# Patient Record
Sex: Female | Born: 1955
Health system: Southern US, Community
[De-identification: ages and names within clinical notes are randomized; demographics above are authoritative.]

## PROBLEM LIST (undated history)

## (undated) DIAGNOSIS — Z9109 Other allergy status, other than to drugs and biological substances: Secondary | ICD-10-CM

## (undated) DIAGNOSIS — M810 Age-related osteoporosis without current pathological fracture: Secondary | ICD-10-CM

## (undated) DIAGNOSIS — Z22322 Carrier or suspected carrier of Methicillin resistant Staphylococcus aureus: Secondary | ICD-10-CM

## (undated) DIAGNOSIS — J189 Pneumonia, unspecified organism: Secondary | ICD-10-CM

## (undated) DIAGNOSIS — F419 Anxiety disorder, unspecified: Secondary | ICD-10-CM

## (undated) DIAGNOSIS — K219 Gastro-esophageal reflux disease without esophagitis: Secondary | ICD-10-CM

## (undated) DIAGNOSIS — R51 Headache: Secondary | ICD-10-CM

## (undated) DIAGNOSIS — M8580 Other specified disorders of bone density and structure, unspecified site: Secondary | ICD-10-CM

## (undated) DIAGNOSIS — I10 Essential (primary) hypertension: Secondary | ICD-10-CM

## (undated) DIAGNOSIS — L111 Transient acantholytic dermatosis [Grover]: Secondary | ICD-10-CM

## (undated) DIAGNOSIS — L719 Rosacea, unspecified: Secondary | ICD-10-CM

## (undated) DIAGNOSIS — F1011 Alcohol abuse, in remission: Secondary | ICD-10-CM

## (undated) DIAGNOSIS — R519 Headache, unspecified: Secondary | ICD-10-CM

## (undated) HISTORY — PX: BUNIONECTOMY: SHX129

## (undated) HISTORY — DX: Essential (primary) hypertension: I10

## (undated) HISTORY — DX: Rosacea, unspecified: L71.9

## (undated) HISTORY — PX: ARTHRODESIS FOOT WITH WEIL OSTEOTOMY AND CHILECTOMY: SHX5590

## (undated) HISTORY — DX: Carrier or suspected carrier of methicillin resistant Staphylococcus aureus: Z22.322

## (undated) HISTORY — DX: Alcohol abuse, in remission: F10.11

## (undated) HISTORY — PX: THERAPEUTIC ABORTION: SHX798

## (undated) HISTORY — DX: Anxiety disorder, unspecified: F41.9

## (undated) HISTORY — DX: Other allergy status, other than to drugs and biological substances: Z91.09

## (undated) HISTORY — DX: Other specified disorders of bone density and structure, unspecified site: M85.80

## (undated) HISTORY — PX: TONSILLECTOMY: SUR1361

---
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
## 1973-11-23 HISTORY — PX: CYSTOSCOPY: SUR368

## 1985-11-23 HISTORY — PX: LIPOMA EXCISION: SHX5283

## 1998-03-13 ENCOUNTER — Other Ambulatory Visit: Admission: RE | Admit: 1998-03-13 | Discharge: 1998-03-13 | Payer: Self-pay | Admitting: Obstetrics and Gynecology

## 1999-03-18 ENCOUNTER — Other Ambulatory Visit: Admission: RE | Admit: 1999-03-18 | Discharge: 1999-03-18 | Payer: Self-pay | Admitting: Gynecology

## 2000-02-27 ENCOUNTER — Encounter: Admission: RE | Admit: 2000-02-27 | Discharge: 2000-02-27 | Payer: Self-pay | Admitting: Family Medicine

## 2000-02-27 ENCOUNTER — Encounter: Payer: Self-pay | Admitting: Family Medicine

## 2000-03-10 ENCOUNTER — Other Ambulatory Visit: Admission: RE | Admit: 2000-03-10 | Discharge: 2000-03-10 | Payer: Self-pay | Admitting: Gynecology

## 2001-03-18 ENCOUNTER — Other Ambulatory Visit: Admission: RE | Admit: 2001-03-18 | Discharge: 2001-03-18 | Payer: Self-pay | Admitting: Gynecology

## 2002-06-09 ENCOUNTER — Other Ambulatory Visit: Admission: RE | Admit: 2002-06-09 | Discharge: 2002-06-09 | Payer: Self-pay | Admitting: Gynecology

## 2003-06-29 ENCOUNTER — Other Ambulatory Visit: Admission: RE | Admit: 2003-06-29 | Discharge: 2003-06-29 | Payer: Self-pay | Admitting: Gynecology

## 2004-07-03 ENCOUNTER — Other Ambulatory Visit: Admission: RE | Admit: 2004-07-03 | Discharge: 2004-07-03 | Payer: Self-pay | Admitting: Gynecology

## 2004-11-19 ENCOUNTER — Encounter: Admission: RE | Admit: 2004-11-19 | Discharge: 2004-11-19 | Payer: Self-pay | Admitting: Family Medicine

## 2005-07-07 ENCOUNTER — Other Ambulatory Visit: Admission: RE | Admit: 2005-07-07 | Discharge: 2005-07-07 | Payer: Self-pay | Admitting: Gynecology

## 2006-07-08 ENCOUNTER — Other Ambulatory Visit: Admission: RE | Admit: 2006-07-08 | Discharge: 2006-07-08 | Payer: Self-pay | Admitting: Gynecology

## 2007-07-11 ENCOUNTER — Other Ambulatory Visit: Admission: RE | Admit: 2007-07-11 | Discharge: 2007-07-11 | Payer: Self-pay | Admitting: Gynecology

## 2008-07-11 ENCOUNTER — Other Ambulatory Visit: Admission: RE | Admit: 2008-07-11 | Discharge: 2008-07-11 | Payer: Self-pay | Admitting: Gynecology

## 2008-08-20 ENCOUNTER — Ambulatory Visit: Payer: Self-pay | Admitting: Women's Health

## 2008-08-24 ENCOUNTER — Ambulatory Visit: Payer: Self-pay | Admitting: Women's Health

## 2009-01-03 ENCOUNTER — Encounter: Payer: Self-pay | Admitting: Women's Health

## 2009-01-03 ENCOUNTER — Ambulatory Visit: Payer: Self-pay | Admitting: Women's Health

## 2009-01-03 ENCOUNTER — Other Ambulatory Visit: Admission: RE | Admit: 2009-01-03 | Discharge: 2009-01-03 | Payer: Self-pay | Admitting: Gynecology

## 2009-07-12 ENCOUNTER — Other Ambulatory Visit: Admission: RE | Admit: 2009-07-12 | Discharge: 2009-07-12 | Payer: Self-pay | Admitting: Gynecology

## 2009-07-12 ENCOUNTER — Ambulatory Visit: Payer: Self-pay | Admitting: Women's Health

## 2009-07-12 ENCOUNTER — Encounter: Payer: Self-pay | Admitting: Women's Health

## 2010-08-29 ENCOUNTER — Other Ambulatory Visit: Admission: RE | Admit: 2010-08-29 | Discharge: 2010-08-29 | Payer: Self-pay | Admitting: Obstetrics and Gynecology

## 2010-08-29 ENCOUNTER — Ambulatory Visit: Payer: Self-pay | Admitting: Women's Health

## 2010-10-15 ENCOUNTER — Ambulatory Visit: Payer: Self-pay | Admitting: Gynecology

## 2011-08-28 ENCOUNTER — Encounter: Payer: Self-pay | Admitting: Anesthesiology

## 2011-09-07 ENCOUNTER — Ambulatory Visit (INDEPENDENT_AMBULATORY_CARE_PROVIDER_SITE_OTHER): Payer: 59 | Admitting: Women's Health

## 2011-09-07 ENCOUNTER — Encounter: Payer: Self-pay | Admitting: Women's Health

## 2011-09-07 ENCOUNTER — Other Ambulatory Visit (HOSPITAL_COMMUNITY)
Admission: RE | Admit: 2011-09-07 | Discharge: 2011-09-07 | Disposition: A | Discharge status: Home / Self Care | Payer: 59 | Source: Ambulatory Visit | Attending: Gynecology | Admitting: Gynecology

## 2011-09-07 VITALS — BP 130/72 | Ht 64.5 in | Wt 150.0 lb

## 2011-09-07 DIAGNOSIS — F411 Generalized anxiety disorder: Secondary | ICD-10-CM

## 2011-09-07 DIAGNOSIS — B009 Herpesviral infection, unspecified: Secondary | ICD-10-CM

## 2011-09-07 DIAGNOSIS — R102 Pelvic and perineal pain: Secondary | ICD-10-CM

## 2011-09-07 DIAGNOSIS — N949 Unspecified condition associated with female genital organs and menstrual cycle: Secondary | ICD-10-CM

## 2011-09-07 DIAGNOSIS — F419 Anxiety disorder, unspecified: Secondary | ICD-10-CM

## 2011-09-07 DIAGNOSIS — A609 Anogenital herpesviral infection, unspecified: Secondary | ICD-10-CM

## 2011-09-07 DIAGNOSIS — Z01419 Encounter for gynecological examination (general) (routine) without abnormal findings: Secondary | ICD-10-CM

## 2011-09-07 DIAGNOSIS — Z833 Family history of diabetes mellitus: Secondary | ICD-10-CM

## 2011-09-07 MED ORDER — ALPRAZOLAM 0.25 MG PO TABS: 0.2500 mg | ORAL_TABLET | Freq: Every evening | ORAL | Status: AC | PRN

## 2011-09-07 MED ORDER — VALACYCLOVIR HCL 500 MG PO TABS: 500.0000 mg | ORAL_TABLET | Freq: Every day | ORAL | Status: AC

## 2011-09-07 NOTE — Progress Notes (Signed)
Kimberly Hale 1956/08/23 [id number]    History:    The patient presents for annual exam.  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  Past medical history, past surgical history, family history and social history were all reviewed and documented in the EPIC chart.   ROS:  A  ROS was performed and pertinent positives and negatives are included in the history.  Exam:  Filed Vitals:   09/07/11 0832  BP: 130/72    General appearance:  Normal Head/Neck:  Normal, without cervical or supraclavicular adenopathy. Thyroid:  Symmetrical, normal in size, without palpable masses or nodularity. Respiratory  Effort:  Normal  Auscultation:  Clear without wheezing or rhonchi Cardiovascular  Auscultation:  Regular rate, without rubs, murmurs or gallops  Edema/varicosities:  Not grossly evident Abdominal  Soft,nontender, without masses, guarding or rebound.  Liver/spleen:  No organomegaly noted  Hernia:  None appreciated  Skin  Inspection:  Grossly normal  Palpation:  Grossly normal Neurologic/psychiatric  Orientation:  Normal with appropriate conversation.  Mood/affect:  Normal  Genitourinary    Breasts: Examined lying and sitting.     Right: Without masses, retractions, discharge or axillary adenopathy.     Left: Without masses, retractions, discharge or axillary adenopathy.   Inguinal/mons:  Normal without inguinal adenopathy  External genitalia:  Normal  BUS/Urethra/Skene's glands:  Normal  Bladder:  Normal  Vagina:  Normal  Cervix:  Normal,stenotic  Uterus:   normal in size, shape and contour.  Midline and mobile  Adnexa/parametria:     Rt: Without masses or tenderness.   Lt: Without masses or tenderness.  Anus and perineum: Normal  Digital rectal exam: Normal sphincter tone without palpated masses or tenderness  Assessment/Plan:  55 y.o. MWF G3P1 for annual exam. Postmenopausal on no HRT with no bleeding. States has had some intermittent low pelvic cramping.  Denies any GI problems. Negative colonoscopy in 07. DEXA in 2011 osteopenia with a T score -1.8. Fall prevention, home safety reviewed, calcium rich diet encouraged, continue vitamin D 1000 daily.    Postmenopausal with lower abdominal cramping   Plan: Ultrasound to rule out GYN problem, will schedule. SBEs, annual mammogram which have been normal. Calcium rich diet, vitamin D 1000 daily encouraged. Encouraged exercise. Weight has been stable, has lost several pounds from last year. CBC, glucose UA and Pap had a normal lipid profile last year will repeat next. Has rare HSV outbreaks, Valtrex 500 twice a day for several days as needed prescription was given. Uses Xanax 0.25 rarely, prescription was given is aware of addictive properties.   Harrington Challenger WHNP, 9:20 AM 09/07/2011

## 2011-09-17 ENCOUNTER — Ambulatory Visit (INDEPENDENT_AMBULATORY_CARE_PROVIDER_SITE_OTHER): Payer: 59 | Admitting: Women's Health

## 2011-09-17 ENCOUNTER — Other Ambulatory Visit: Payer: Self-pay | Admitting: Women's Health

## 2011-09-17 ENCOUNTER — Ambulatory Visit (INDEPENDENT_AMBULATORY_CARE_PROVIDER_SITE_OTHER): Payer: 59

## 2011-09-17 DIAGNOSIS — N949 Unspecified condition associated with female genital organs and menstrual cycle: Secondary | ICD-10-CM

## 2011-09-17 DIAGNOSIS — R102 Pelvic and perineal pain: Secondary | ICD-10-CM

## 2011-09-17 DIAGNOSIS — N83209 Unspecified ovarian cyst, unspecified side: Secondary | ICD-10-CM

## 2011-09-17 NOTE — Progress Notes (Signed)
  Presents for an ultrasound related to pelvic discomfort/pressure discussed at annual exam last week. Postmenopausal with no bleeding on no HRT. Ultrasound today does show an endometrium of 3.6 mm, retroverted uterus, right ovary normal, left ovary shows a solid mass on the border of the ovary 20 x 15 mm with positive PFD, difficult to image. No previous ultrasounds to compare.  Plan: CA 125, if normal, ultrasound in 2 months to check for resolution or stability.

## 2011-11-15 ENCOUNTER — Emergency Department (HOSPITAL_COMMUNITY)
Admission: EM | Admit: 2011-11-15 | Discharge: 2011-11-16 | Disposition: A | Discharge status: Home / Self Care | Payer: 59 | Attending: Emergency Medicine | Admitting: Emergency Medicine

## 2011-11-15 ENCOUNTER — Encounter (HOSPITAL_COMMUNITY): Payer: Self-pay | Admitting: *Deleted

## 2011-11-15 DIAGNOSIS — S61011A Laceration without foreign body of right thumb without damage to nail, initial encounter: Secondary | ICD-10-CM

## 2011-11-15 DIAGNOSIS — W268XXA Contact with other sharp object(s), not elsewhere classified, initial encounter: Secondary | ICD-10-CM | POA: Insufficient documentation

## 2011-11-15 DIAGNOSIS — S61209A Unspecified open wound of unspecified finger without damage to nail, initial encounter: Secondary | ICD-10-CM | POA: Insufficient documentation

## 2011-11-15 NOTE — ED Notes (Signed)
C/o L thumb lac, cut with razor blade while trying to get a label of a jar, Td UTD , (unsure sometime in last 10 yrs), describes pain as mild.

## 2011-11-16 NOTE — ED Provider Notes (Signed)
History     CSN: [id number]  Arrival date & time 11/15/11  2156   First MD Initiated Contact with Patient 11/15/11 2323      Chief Complaint  Patient presents with  . Laceration    (Consider location/radiation/quality/duration/timing/severity/associated sxs/prior treatment) HPI Comments: Patient was cutting a label of the jar with a razor blade slipped and she has a small superficial laceration to the very distal tip of her left thumb.  That was bleeding at this time.  The bleeding has subsided.  Last tetanus shot within the last 10 years, but in the latter part of 10, years.  We'll update today  Patient is a 55 y.o. female presenting with skin laceration. The history is provided by the patient.  Laceration  The incident occurred 1 to 2 hours ago. The laceration is located on the left hand. The laceration is 1 cm in size. The laceration mechanism was a a metal edge. The pain is at a severity of 1/10. The pain is mild. Her tetanus status is out of date.    Past Medical History  Diagnosis Date  . Rosacea   . Anxiety   . Environmental allergies   . Osteopenia     07/2008 DEXA  . History of ETOH abuse     NONE IN 10 YEARS    Past Surgical History  Procedure Date  . Bunionectomy   . Cesarean section 1988  . Lipoma excision 1987  . Therapeutic abortion     X2  . Cystoscopy 1975    Family History  Problem Relation Age of Onset  . Alcohol abuse Mother   . Heart disease Father   . Alcohol abuse Father   . Diabetes Maternal Grandmother   . Hypertension Maternal Grandmother     History  Substance Use Topics  . Smoking status: Never Smoker   . Smokeless tobacco: Never Used  . Alcohol Use: No    OB History    Grav Para Term Preterm Abortions TAB SAB Ect Mult Living   3 1   2 2    1       Review of Systems  Constitutional: Negative.   HENT: Negative.   Eyes: Negative.   Cardiovascular: Negative.   Genitourinary: Negative.   Musculoskeletal: Negative.     Neurological: Negative.   Hematological: Negative.   Psychiatric/Behavioral: Negative.     Allergies  Sulfa antibiotics  Home Medications   Current Outpatient Rx  Name Route Sig Dispense Refill  . BUSPIRONE HCL 10 MG PO TABS Oral Take 10 mg by mouth. 1/2 tab bid    . CALCIUM CARBONATE 600 MG PO TABS Oral Take 600 mg by mouth daily.     Marland Kitchen VITAMIN D 1000 UNITS PO TABS Oral Take 1,000 Units by mouth daily.      Marland Kitchen FLUTICASONE PROPIONATE 50 MCG/ACT NA SUSP Nasal Place 2 sprays into the nose daily.      . IBUPROFEN 200 MG PO TABS Oral Take 400 mg by mouth every 6 (six) hours as needed. For pain.    Marland Kitchen METRONIDAZOLE 0.75 % EX GEL Topical Apply topically 2 (two) times daily.      Marland Kitchen ONE-DAILY MULTI VITAMINS PO TABS Oral Take 1 tablet by mouth daily.        BP 146/80  Pulse 88  Temp 98.1 F (36.7 C)  Resp 18  SpO2 98%  LMP 10/08/2007  Physical Exam  Constitutional: She is oriented to person, place, and time. She appears well-developed  and well-nourished.  HENT:  Head: Normocephalic.  Eyes: Pupils are equal, round, and reactive to light.  Neck: Normal range of motion.  Pulmonary/Chest: Effort normal.  Musculoskeletal: She exhibits no edema and no tenderness.  Neurological: She is oriented to person, place, and time.  Skin:       Superficial laceration, less than 1/2 cm long just at the distal tip of the left thumb lateral to nail    ED Course  LACERATION REPAIR Performed by: Arman Filter Authorized by: Arman Filter Consent: Verbal consent obtained. Consent given by: patient Patient identity confirmed: verbally with patient Body area: upper extremity Tendon involvement: none Nerve involvement: none Vascular damage: no Patient sedated: no Irrigation solution: saline Debridement: none Degree of undermining: none Skin closure: glue Patient tolerance: Patient tolerated the procedure well with no immediate complications.   (including critical care time)  Labs  Reviewed - No data to display No results found.   1. Laceration of right thumb       MDM  Superficial laceration distal left        Arman Filter, NP 11/16/11 0035  Arman Filter, NP 11/16/11 [phone]

## 2011-11-16 NOTE — ED Provider Notes (Signed)
Medical screening examination/treatment/procedure(s) were performed by non-physician practitioner and as supervising physician I was immediately available for consultation/collaboration.   Beauty Pless M Xylah Early, DO 11/16/11 0925 

## 2011-11-16 NOTE — ED Notes (Signed)
Pt states she understands discharge instructions.

## 2011-11-20 ENCOUNTER — Other Ambulatory Visit: Payer: Self-pay | Admitting: Women's Health

## 2011-11-20 ENCOUNTER — Ambulatory Visit (INDEPENDENT_AMBULATORY_CARE_PROVIDER_SITE_OTHER): Payer: 59 | Admitting: Women's Health

## 2011-11-20 ENCOUNTER — Ambulatory Visit: Payer: 59

## 2011-11-20 DIAGNOSIS — N83209 Unspecified ovarian cyst, unspecified side: Secondary | ICD-10-CM

## 2011-11-20 DIAGNOSIS — R1904 Left lower quadrant abdominal swelling, mass and lump: Secondary | ICD-10-CM

## 2011-11-20 DIAGNOSIS — R1032 Left lower quadrant pain: Secondary | ICD-10-CM

## 2011-11-20 NOTE — Progress Notes (Signed)
Patient ID: Kimberly Hale, female   DOB: 27-Jun-1956, 55 y.o.   MRN: [id number] Presents for [place] followup.  [place] 09/17/11 showed solid mass inferior to left ovary 20x45mm, Ca 125 was 6.1.  [place] today: lt ovarian solid avascular 10x13x7mm, pain free now. No uterine abnormalities seen. Rt ovary normal with small echofree, thin walled avascular cyst 5x74mm.    Plan repeat US in 6 months to check for stability/resolution.  Instructed to call or return to office if changes, pain or bloating.

## 2011-12-13 ENCOUNTER — Other Ambulatory Visit: Payer: Self-pay | Admitting: Women's Health

## 2012-01-19 ENCOUNTER — Ambulatory Visit
Admission: RE | Admit: 2012-01-19 | Discharge: 2012-01-19 | Disposition: A | Discharge status: Home / Self Care | Payer: 59 | Source: Ambulatory Visit | Attending: Family Medicine | Admitting: Family Medicine

## 2012-01-19 ENCOUNTER — Other Ambulatory Visit: Payer: Self-pay | Admitting: Family Medicine

## 2012-01-19 DIAGNOSIS — W19XXXA Unspecified fall, initial encounter: Secondary | ICD-10-CM

## 2012-05-19 ENCOUNTER — Encounter: Payer: Self-pay | Admitting: Obstetrics and Gynecology

## 2012-09-09 ENCOUNTER — Ambulatory Visit (INDEPENDENT_AMBULATORY_CARE_PROVIDER_SITE_OTHER): Payer: 59 | Admitting: Women's Health

## 2012-09-09 ENCOUNTER — Encounter: Payer: Self-pay | Admitting: Women's Health

## 2012-09-09 VITALS — BP 142/86 | Ht 64.5 in | Wt 154.0 lb

## 2012-09-09 DIAGNOSIS — L719 Rosacea, unspecified: Secondary | ICD-10-CM

## 2012-09-09 DIAGNOSIS — M899 Disorder of bone, unspecified: Secondary | ICD-10-CM

## 2012-09-09 DIAGNOSIS — F419 Anxiety disorder, unspecified: Secondary | ICD-10-CM

## 2012-09-09 DIAGNOSIS — F411 Generalized anxiety disorder: Secondary | ICD-10-CM

## 2012-09-09 DIAGNOSIS — M8580 Other specified disorders of bone density and structure, unspecified site: Secondary | ICD-10-CM

## 2012-09-09 DIAGNOSIS — Z13220 Encounter for screening for lipoid disorders: Secondary | ICD-10-CM

## 2012-09-09 DIAGNOSIS — B009 Herpesviral infection, unspecified: Secondary | ICD-10-CM

## 2012-09-09 DIAGNOSIS — Z01419 Encounter for gynecological examination (general) (routine) without abnormal findings: Secondary | ICD-10-CM

## 2012-09-09 DIAGNOSIS — A609 Anogenital herpesviral infection, unspecified: Secondary | ICD-10-CM

## 2012-09-09 DIAGNOSIS — Z833 Family history of diabetes mellitus: Secondary | ICD-10-CM

## 2012-09-09 LAB — CBC WITH DIFFERENTIAL/PLATELET
Basophils Absolute: 0 10*3/uL (ref 0.0–0.1)
Basophils Relative: 1 % (ref 0–1)
Eosinophils Absolute: 0.1 10*3/uL (ref 0.0–0.7)
Eosinophils Relative: 2 % (ref 0–5)
MCH: 30.2 pg (ref 26.0–34.0)
MCHC: 35 g/dL (ref 30.0–36.0)
MCV: 86.1 fL (ref 78.0–100.0)
Neutrophils Relative %: 57 % (ref 43–77)
Platelets: 284 10*3/uL (ref 150–400)
RBC: 4.61 MIL/uL (ref 3.87–5.11)
RDW: 13.4 % (ref 11.5–15.5)

## 2012-09-09 LAB — GLUCOSE, RANDOM: Glucose, Bld: 95 mg/dL (ref 70–99)

## 2012-09-09 LAB — LIPID PANEL: LDL Cholesterol: 134 mg/dL — ABNORMAL HIGH (ref 0–99)

## 2012-09-09 MED ORDER — VALACYCLOVIR HCL 500 MG PO TABS: 500.0000 mg | ORAL_TABLET | Freq: Every morning | ORAL | Status: DC

## 2012-09-09 MED ORDER — ALPRAZOLAM 0.25 MG PO TABS: 0.2500 mg | ORAL_TABLET | Freq: Every evening | ORAL | Status: DC | PRN

## 2012-09-09 NOTE — Progress Notes (Signed)
Kimberly Hale August 14, 1956 [id number]    History:    The patient presents for annual exam. Postmenopausal with no bleeding on no HRT. Rosacea managed by dermatologist with [facility] twice daily. Osteopenia T score of -1.8 right femoral neck 11/11. Has left hip pain, T score 0.2. History of normal Paps and mammograms. Normal colonoscopy 2007.  Past medical history, past surgical history, family history and social history were all reviewed and documented in the EPIC chart. 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 History of alcohol abuse many years ago, both parents history of alcohol abuse.   ROS:  A  ROS was performed and pertinent positives and negatives are included in the history.   Exam:  Filed Vitals:   09/09/12 0907  BP: 142/86    General appearance:  Normal Head/Neck:  Normal, without cervical or supraclavicular adenopathy. Thyroid:  Symmetrical, normal in size, without palpable masses or nodularity. Respiratory  Effort:  Normal  Auscultation:  Clear without wheezing or rhonchi Cardiovascular  Auscultation:  Regular rate, without rubs, murmurs or gallops  Edema/varicosities:  Not grossly evident Abdominal  Soft,nontender, without masses, guarding or rebound.  Liver/spleen:  No organomegaly noted  Hernia:  None appreciated  Skin  Inspection:  Grossly normal  Palpation:  Grossly normal Neurologic/psychiatric  Orientation:  Normal with appropriate conversation.  Mood/affect:  Normal  Genitourinary    Breasts: Examined lying and sitting.     Right: Without masses, retractions, discharge or axillary adenopathy.     Left: Without masses, retractions, discharge or axillary adenopathy.   Inguinal/mons:  Normal without inguinal adenopathy  External genitalia:  Normal  BUS/Urethra/Skene's glands:  Normal  Bladder:  Normal  Vagina:  Normal  Cervix:  Normal  Uterus:  normal in size, shape and contour.  Midline and mobile  Adnexa/parametria:     Rt: Without masses or  tenderness.   Lt: Without masses or tenderness.  Anus and perineum: Normal  Digital rectal exam: Normal sphincter tone without palpated masses or tenderness  Assessment/Plan:  56 y.o. MWF G3 P1 for annual exam with no complaints.  Blood pressure  144/86 today Postmenopausal no HRT/ no bleeding Osteopenia T score of -1.8 right femoral neck - 2011 Rosacea managed dermatologist  Plan: Reports normal blood pressure at [employer] when donating blood, will monitor blood pressure if greater than 130/80 will followup with primary care. SBE's, continue annual mammogram, calcium rich diet, vitamin D 2000 daily encouraged. Repeat DEXA this year, history of osteopenia will schedule. CBC, glucose, lipid panel, UA. No Pap normal Pap 2012 new screening guidelines reviewed. Has occasional anxiety, Xanax 0.25 prescription given aware of addictive properties and uses sparingly.            Harrington Challenger WHNP, 10:15 AM 09/09/2012

## 2012-09-09 NOTE — Patient Instructions (Addendum)

## 2012-09-10 LAB — URINALYSIS W MICROSCOPIC + REFLEX CULTURE
Casts: NONE SEEN
Glucose, UA: NEGATIVE mg/dL
Hgb urine dipstick: NEGATIVE
Leukocytes, UA: NEGATIVE
Nitrite: NEGATIVE
Protein, ur: NEGATIVE mg/dL
Squamous Epithelial / LPF: NONE SEEN
pH: 7.5 (ref 5.0–8.0)

## 2012-09-16 ENCOUNTER — Encounter: Payer: Self-pay | Admitting: Gynecology

## 2012-09-19 ENCOUNTER — Other Ambulatory Visit: Payer: Self-pay | Admitting: Anesthesiology

## 2012-09-19 DIAGNOSIS — Z1211 Encounter for screening for malignant neoplasm of colon: Secondary | ICD-10-CM

## 2012-09-19 DIAGNOSIS — K921 Melena: Secondary | ICD-10-CM

## 2012-09-20 ENCOUNTER — Other Ambulatory Visit: Payer: Self-pay | Admitting: Women's Health

## 2012-09-20 DIAGNOSIS — Z1211 Encounter for screening for malignant neoplasm of colon: Secondary | ICD-10-CM

## 2012-11-22 ENCOUNTER — Other Ambulatory Visit: Payer: Self-pay | Admitting: Gynecology

## 2012-11-22 ENCOUNTER — Ambulatory Visit (INDEPENDENT_AMBULATORY_CARE_PROVIDER_SITE_OTHER): Payer: 59

## 2012-11-22 DIAGNOSIS — M8580 Other specified disorders of bone density and structure, unspecified site: Secondary | ICD-10-CM

## 2012-11-22 DIAGNOSIS — M899 Disorder of bone, unspecified: Secondary | ICD-10-CM

## 2012-11-24 ENCOUNTER — Telehealth: Payer: Self-pay | Admitting: Gynecology

## 2012-11-24 ENCOUNTER — Encounter: Payer: Self-pay | Admitting: Gynecology

## 2012-11-24 NOTE — Telephone Encounter (Signed)
Tell patient that her bone density does show osteopenia with some deterioration and I want to talk to her about treatment options.  Recommend office visit to discuss.

## 2012-11-24 NOTE — Telephone Encounter (Signed)
Left message for pt to call.

## 2012-11-25 NOTE — Telephone Encounter (Signed)
Patient informed.  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

## 2012-11-25 NOTE — Telephone Encounter (Signed)
Left message for patient to call me regarding Bone Density test results.

## 2013-01-07 ENCOUNTER — Other Ambulatory Visit: Payer: Self-pay

## 2013-01-24 ENCOUNTER — Institutional Professional Consult (permissible substitution): Payer: 59 | Admitting: Gynecology

## 2013-02-03 ENCOUNTER — Encounter: Payer: Self-pay | Admitting: Gynecology

## 2013-02-03 ENCOUNTER — Ambulatory Visit: Payer: Commercial Managed Care - PPO | Admitting: Gynecology

## 2013-02-03 DIAGNOSIS — M8580 Other specified disorders of bone density and structure, unspecified site: Secondary | ICD-10-CM

## 2013-02-03 DIAGNOSIS — M810 Age-related osteoporosis without current pathological fracture: Secondary | ICD-10-CM

## 2013-02-03 NOTE — Patient Instructions (Signed)
Followup for annual exam in October 2014. Repeat bone density in 2 years.

## 2013-02-03 NOTE — Progress Notes (Signed)
Patient presents to discuss her recent DEXA results showing T score -2.2 with FRAX 30%/2.8%. I reviewed the issues of osteopenia/osteoporosis and increased fracture risk. Comparing from prior study she did show a statistically significant decline in both hips and her spine was normal stable. On review of her FRAX questionnaire it was checked positive for previous fracture and positive for parenteral fracture. Her mother did fracture both hips 1 at 55 and 1 at 22 she is alive at 62. No other family history of osteoporosis/fracture. The patient did fracture her ankle last year while chasing her dog through the woods and she turned her ankle. I reviewed with her that this does not sound like a fragility fracture and we recalculated her FRAX answering no to previous fracture and her new 10 year probability is 19% major 1.6% hip fracture. She still recognizes that she is significantly osteopenic with decline from her prior study. Treatment options reviewed to include bisphosphates. Risks discussed include osteonecrosis of the jaw atypical fractures particularly with prolonged use GERD and esophageal cancer. After lengthy discussion the patient and I both agree to hold on treatment at this point check her vitamin D level today maximize calcium and vitamin D intake weightbearing exercise and recheck her DEXA in 2 years.

## 2013-03-13 ENCOUNTER — Other Ambulatory Visit: Payer: Self-pay | Admitting: Women's Health

## 2013-05-23 ENCOUNTER — Encounter: Payer: Self-pay | Admitting: Gynecology

## 2013-09-15 ENCOUNTER — Ambulatory Visit (INDEPENDENT_AMBULATORY_CARE_PROVIDER_SITE_OTHER): Payer: Commercial Managed Care - PPO | Admitting: Women's Health

## 2013-09-15 ENCOUNTER — Encounter: Payer: Self-pay | Admitting: Women's Health

## 2013-09-15 VITALS — BP 140/82 | Ht 64.35 in | Wt 155.0 lb

## 2013-09-15 DIAGNOSIS — M8580 Other specified disorders of bone density and structure, unspecified site: Secondary | ICD-10-CM

## 2013-09-15 DIAGNOSIS — B009 Herpesviral infection, unspecified: Secondary | ICD-10-CM

## 2013-09-15 DIAGNOSIS — E079 Disorder of thyroid, unspecified: Secondary | ICD-10-CM

## 2013-09-15 DIAGNOSIS — F419 Anxiety disorder, unspecified: Secondary | ICD-10-CM

## 2013-09-15 DIAGNOSIS — Z13220 Encounter for screening for lipoid disorders: Secondary | ICD-10-CM

## 2013-09-15 DIAGNOSIS — Z833 Family history of diabetes mellitus: Secondary | ICD-10-CM

## 2013-09-15 DIAGNOSIS — Z01419 Encounter for gynecological examination (general) (routine) without abnormal findings: Secondary | ICD-10-CM

## 2013-09-15 DIAGNOSIS — F411 Generalized anxiety disorder: Secondary | ICD-10-CM

## 2013-09-15 DIAGNOSIS — M899 Disorder of bone, unspecified: Secondary | ICD-10-CM

## 2013-09-15 LAB — CBC WITH DIFFERENTIAL/PLATELET
Basophils Absolute: 0 10*3/uL (ref 0.0–0.1)
Basophils Relative: 1 % (ref 0–1)
Eosinophils Absolute: 0.2 10*3/uL (ref 0.0–0.7)
Eosinophils Relative: 3 % (ref 0–5)
HCT: 40.3 % (ref 36.0–46.0)
MCH: 30.4 pg (ref 26.0–34.0)
MCHC: 34.7 g/dL (ref 30.0–36.0)
MCV: 87.4 fL (ref 78.0–100.0)
Monocytes Absolute: 0.5 10*3/uL (ref 0.1–1.0)
Platelets: 274 10*3/uL (ref 150–400)
RDW: 14 % (ref 11.5–15.5)
WBC: 5.8 10*3/uL (ref 4.0–10.5)

## 2013-09-15 MED ORDER — VALACYCLOVIR HCL 500 MG PO TABS: ORAL_TABLET | ORAL | Status: DC

## 2013-09-15 MED ORDER — ALPRAZOLAM 0.25 MG PO TABS: 0.2500 mg | ORAL_TABLET | Freq: Every evening | ORAL | Status: DC | PRN

## 2013-09-15 NOTE — Progress Notes (Signed)
Kimberly Hale 01-31-1956 [id number]    History:    The patient presents for annual exam.  Postmenopausal/no HRT/no bleeding. Normal Pap and mammogram history. Positive home Hemoccult  2013, negative colonoscopy 11/2012.  DEXA 2014 T score -2.2 right femoral neck FRAX 30%/2.8%, discussed with Dr. Audie Box, will continue exercise, vitamin D. Rare HSV outbreaks.    Past medical history, past surgical history, family history and social history were all reviewed and documented in the EPIC chart. Position a [employer] per dermatologist. Anxiety/depression managed primary care. Works at [employer], husband Radio producer, son graduated Building services engineer. History of alcohol abuse none in many years. Both parents alcohol abuse.   ROS:  A  ROS was performed and pertinent positives and negatives are included in the history.  Exam:  Filed Vitals:   09/15/13 1609  BP: 140/82    General appearance:  Normal Head/Neck:  Normal, without cervical or supraclavicular adenopathy. Thyroid:  Symmetrical, normal in size, without palpable masses or nodularity. Respiratory  Effort:  Normal  Auscultation:  Clear without wheezing or rhonchi Cardiovascular  Auscultation:  Regular rate, without rubs, murmurs or gallops  Edema/varicosities:  Not grossly evident Abdominal  Soft,nontender, without masses, guarding or rebound.  Liver/spleen:  No organomegaly noted  Hernia:  None appreciated  Skin  Inspection:  Grossly normal  Palpation:  Grossly normal Neurologic/psychiatric  Orientation:  Normal with appropriate conversation.  Mood/affect:  Normal  Genitourinary    Breasts: Examined lying and sitting.     Right: Without masses, retractions, discharge or axillary adenopathy.     Left: Without masses, retractions, discharge or axillary adenopathy.   Inguinal/mons:  Normal without inguinal adenopathy  External genitalia:  Normal  BUS/Urethra/Skene's glands:  Normal  Bladder:  Normal  Vagina:  Normal  Cervix:   Normal/stenotic  Uterus:  normal in size, shape and contour.  Midline and mobile  Adnexa/parametria:     Rt: Without masses or tenderness.   Lt: Without masses or tenderness.  Anus and perineum: Normal  Digital rectal exam: Normal sphincter tone without palpated masses or tenderness  Assessment/Plan:  57 y.o. M. WF G3 P1  for annual exam with no complaints.  Osteopenia Borderline blood pressure HSV-rare outbreaks Anxiety/depression-primary care manages meds  Plan: Continue to monitor blood pressure, reports less than 120/70 when donating blood every 2 months. Aware of hazards of blood pressure. Home safety, fall prevention, importance of regular exercise, calcium rich diet, vitamin D 2000 daily encouraged. Valtrex 500 twice daily for 3-5 days as needed, prescription proper use given and reviewed. CBC, comprehensive metabolic, lipid panel, UA, Pap. Pap normal 2012, new screening guidelines reviewed    Harrington Challenger [facility], 5:15 PM 09/15/2013

## 2013-09-15 NOTE — Patient Instructions (Signed)
Health Recommendations for Postmenopausal Women Respected and ongoing research has looked at the most common causes of death, disability, and poor quality of life in postmenopausal women. The causes include heart disease, diseases of blood vessels, diabetes, depression, cancer, and bone loss (osteoporosis). Many things can be done to help lower the chances of developing these and other common problems: CARDIOVASCULAR DISEASE Heart Disease: A heart attack is a medical emergency. Know the signs and symptoms of a heart attack. Below are things women can do to reduce their risk for heart disease.   Do not smoke. If you smoke, quit.  Aim for a healthy weight. Being overweight causes many preventable deaths. Eat a healthy and balanced diet and drink an adequate amount of liquids.  Get moving. Make a commitment to be more physically active. Aim for 30 minutes of activity on most, if not all days of the week.  Eat for heart health. Choose a diet that is low in saturated fat and cholesterol and eliminate trans fat. Include whole grains, vegetables, and fruits. Read and understand the labels on food containers before buying.  Know your numbers. Ask your caregiver to check your blood pressure, cholesterol (total, HDL, LDL, triglycerides) and blood glucose. Work with your caregiver on improving your entire clinical picture.  High blood pressure. Limit or stop your table salt intake (try salt substitute and food seasonings). Avoid salty foods and drinks. Read labels on food containers before buying. Eating well and exercising can help control high blood pressure. STROKE  Stroke is a medical emergency. Stroke may be the result of a blood clot in a blood vessel in the brain or by a brain hemorrhage (bleeding). Know the signs and symptoms of a stroke. To lower the risk of developing a stroke:  Avoid fatty foods.  Quit smoking.  Control your diabetes, blood pressure, and irregular heart rate. THROMBOPHLEBITIS  (BLOOD CLOT) OF THE LEG  Becoming overweight and leading a stationary lifestyle may also contribute to developing blood clots. Controlling your diet and exercising will help lower the risk of developing blood clots. CANCER SCREENING  Breast Cancer: Take steps to reduce your risk of breast cancer.  You should practice "breast self-awareness." This means understanding the normal appearance and feel of your breasts and should include breast self-examination. Any changes detected, no matter how small, should be reported to your caregiver.  After age 40, you should have a clinical breast exam (CBE) every year.  Starting at age 40, you should consider having a mammogram (breast X-ray) every year.  If you have a family history of breast cancer, talk to your caregiver about genetic screening.  If you are at high risk for breast cancer, talk to your caregiver about having an MRI and a mammogram every year.  Intestinal or Stomach Cancer: Tests to consider are a rectal exam, fecal occult blood, sigmoidoscopy, and colonoscopy. Women who are high risk may need to be screened at an earlier age and more often.  Cervical Cancer:  Beginning at age 30, you should have a Pap test every 3 years as long as the past 3 Pap tests have been normal.  If you have had past treatment for cervical cancer or a condition that could lead to cancer, you need Pap tests and screening for cancer for at least 20 years after your treatment.  If you had a hysterectomy for a problem that was not cancer or a condition that could lead to cancer, then you no longer need Pap tests.    If you are between ages 65 and 70, and you have had normal Pap tests going back 10 years, you no longer need Pap tests.  If Pap tests have been discontinued, risk factors (such as a new sexual partner) need to be reassessed to determine if screening should be resumed.  Some medical problems can increase the chance of getting cervical cancer. In these  cases, your caregiver may recommend more frequent screening and Pap tests.  Uterine Cancer: If you have vaginal bleeding after reaching menopause, you should notify your caregiver.  Ovarian cancer: Other than yearly pelvic exams, there are no reliable tests available to screen for ovarian cancer at this time except for yearly pelvic exams.  Lung Cancer: Yearly chest X-rays can detect lung cancer and should be done on high risk women, such as cigarette smokers and women with chronic lung disease (emphysema).  Skin Cancer: A complete body skin exam should be done at your yearly examination. Avoid overexposure to the sun and ultraviolet light lamps. Use a strong sun block cream when in the sun. All of these things are important in lowering the risk of skin cancer. MENOPAUSE Menopause Symptoms: Hormone therapy products are effective for treating symptoms associated with menopause:  Moderate to severe hot flashes.  Night sweats.  Mood swings.  Headaches.  Tiredness.  Loss of sex drive.  Insomnia.  Other symptoms. Hormone replacement carries certain risks, especially in older women. Women who use or are thinking about using estrogen or estrogen with progestin treatments should discuss that with their caregiver. Your caregiver will help you understand the benefits and risks. The ideal dose of hormone replacement therapy is not known. The Food and Drug Administration (FDA) has concluded that hormone therapy should be used only at the lowest doses and for the shortest amount of time to reach treatment goals.  OSTEOPOROSIS Protecting Against Bone Loss and Preventing Fracture: If you use hormone therapy for prevention of bone loss (osteoporosis), the risks for bone loss must outweigh the risk of the therapy. Ask your caregiver about other medications known to be safe and effective for preventing bone loss and fractures. To guard against bone loss or fractures, the following is recommended:  If  you are less than age 50, take 1000 mg of calcium and at least 600 mg of Vitamin D per day.  If you are greater than age 50 but less than age 70, take 1200 mg of calcium and at least 600 mg of Vitamin D per day.  If you are greater than age 70, take 1200 mg of calcium and at least 800 mg of Vitamin D per day. Smoking and excessive alcohol intake increases the risk of osteoporosis. Eat foods rich in calcium and vitamin D and do weight bearing exercises several times a week as your caregiver suggests. DIABETES Diabetes Melitus: If you have Type I or Type 2 diabetes, you should keep your blood sugar under control with diet, exercise and recommended medication. Avoid too many sweets, starchy and fatty foods. Being overweight can make control more difficult. COGNITION AND MEMORY Cognition and Memory: Menopausal hormone therapy is not recommended for the prevention of cognitive disorders such as Alzheimer's disease or memory loss.  DEPRESSION  Depression may occur at any age, but is common in elderly women. The reasons may be because of physical, medical, social (loneliness), or financial problems and needs. If you are experiencing depression because of medical problems and control of symptoms, talk to your caregiver about this. Physical activity and   exercise may help with mood and sleep. Community and volunteer involvement may help your sense of value and worth. If you have depression and you feel that the problem is getting worse or becoming severe, talk to your caregiver about treatment options that are best for you. ACCIDENTS  Accidents are common and can be serious in the elderly woman. Prepare your house to prevent accidents. Eliminate throw rugs, place hand bars in the bath, shower and toilet areas. Avoid wearing high heeled shoes or walking on wet, snowy, and icy areas. Limit or stop driving if you have vision or hearing problems, or you feel you are unsteady with you movements and  reflexes. HEPATITIS C Hepatitis C is a type of viral infection affecting the liver. It is spread mainly through contact with blood from an infected person. It can be treated, but if left untreated, it can lead to severe liver damage over years. Many people who are infected do not know that the virus is in their blood. If you are a "baby-boomer", it is recommended that you have one screening test for Hepatitis C. IMMUNIZATIONS  Several immunizations are important to consider having during your senior years, including:   Tetanus, diptheria, and pertussis booster shot.  Influenza every year before the flu season begins.  Pneumonia vaccine.  Shingles vaccine.  Others as indicated based on your specific needs. Talk to your caregiver about these. Document Released: 01/01/2006 Document Revised: 10/26/2012 Document Reviewed: 08/27/2008 Exit[employer] Patient Information 2014 [employer].  

## 2013-09-16 LAB — URINALYSIS W MICROSCOPIC + REFLEX CULTURE
Bilirubin Urine: NEGATIVE
Glucose, UA: NEGATIVE mg/dL
Hgb urine dipstick: NEGATIVE
Leukocytes, UA: NEGATIVE
Protein, ur: NEGATIVE mg/dL
Urobilinogen, UA: 0.2 mg/dL (ref 0.0–1.0)

## 2013-09-16 LAB — COMPREHENSIVE METABOLIC PANEL
ALT: 16 U/L (ref 0–35)
AST: 17 U/L (ref 0–37)
BUN: 12 mg/dL (ref 6–23)
Calcium: 9.2 mg/dL (ref 8.4–10.5)
Creat: 0.82 mg/dL (ref 0.50–1.10)
Total Bilirubin: 0.3 mg/dL (ref 0.3–1.2)

## 2013-09-16 LAB — LIPID PANEL
HDL: 65 mg/dL (ref >39)
Total CHOL/HDL Ratio: 3.1 Ratio
VLDL: 19 mg/dL (ref 0–40)

## 2013-09-16 LAB — TSH: TSH: 0.957 u[IU]/mL (ref 0.350–4.500)

## 2013-09-18 ENCOUNTER — Other Ambulatory Visit (HOSPITAL_COMMUNITY)
Admission: RE | Admit: 2013-09-18 | Discharge: 2013-09-18 | Disposition: A | Discharge status: Home / Self Care | Payer: Commercial Managed Care - PPO | Source: Ambulatory Visit | Attending: Gynecology | Admitting: Gynecology

## 2013-09-18 DIAGNOSIS — Z01419 Encounter for gynecological examination (general) (routine) without abnormal findings: Secondary | ICD-10-CM | POA: Insufficient documentation

## 2013-09-18 NOTE — Addendum Note (Signed)
Addended by: Richardson Chiquito on: 09/18/2013 05:06 PM   Modules accepted: Orders

## 2013-09-28 ENCOUNTER — Other Ambulatory Visit: Payer: Self-pay

## 2014-05-30 ENCOUNTER — Encounter: Payer: Self-pay | Admitting: Gynecology

## 2014-07-12 ENCOUNTER — Other Ambulatory Visit: Payer: Self-pay | Admitting: Women's Health

## 2014-08-07 ENCOUNTER — Ambulatory Visit (INDEPENDENT_AMBULATORY_CARE_PROVIDER_SITE_OTHER): Payer: Commercial Managed Care - PPO

## 2014-08-07 ENCOUNTER — Ambulatory Visit (INDEPENDENT_AMBULATORY_CARE_PROVIDER_SITE_OTHER): Payer: Commercial Managed Care - PPO | Admitting: Women's Health

## 2014-08-07 ENCOUNTER — Encounter: Payer: Self-pay | Admitting: Women's Health

## 2014-08-07 ENCOUNTER — Other Ambulatory Visit: Payer: Self-pay | Admitting: Women's Health

## 2014-08-07 VITALS — BP 150/80

## 2014-08-07 DIAGNOSIS — R1032 Left lower quadrant pain: Secondary | ICD-10-CM

## 2014-08-07 DIAGNOSIS — N83339 Acquired atrophy of ovary and fallopian tube, unspecified side: Secondary | ICD-10-CM

## 2014-08-07 NOTE — Progress Notes (Signed)
Patient ID: Kimberly Hale, female   DOB: 1956/01/05, 58 y.o.   MRN: [id number] Presents with complaint of intermittent left lower quadrant discomfort for 2 months. Denies nausea, constipation, changes in elimitation, history of a small ovarian cyst on the left in the past. Under increased stress, husband  had open-heart surgery, prostate cancer, mother died. Normal colonoscopy 1-1/2 years ago. Postmenopausal/no bleeding/no HRT.  Exam: Appears well but worried. Abdomen soft no rebound or radiation of pain. External genitalia within normal limits, speculum exam no discharge, bimanual no CMT minimal tenderness on left lower quadrant no fullness noted. Ultrasound: Retroverted uterus, endometrium  2.9 mm. Right ovary atrophic. Left ovary atrophic normal echo. Left ovary adjacent to wall of uterus unable to move ovary from left uterine wall. Positive color flow . Negative cul-de-sac. No apparent adnexal masses  right or left.  Moderate amount of stool noted.  Left lower quadrant pain  Plan: MiraLax twice daily until stools more soft, reassured no periods cysts or masses, reviewed possible/probable adhesions causing the ovary to be adhered to uterine wall possibly from C-section. If discomfort persists followup with gastroenterologist.

## 2014-09-24 ENCOUNTER — Encounter: Payer: Self-pay | Admitting: Women's Health

## 2014-09-28 ENCOUNTER — Encounter: Payer: Self-pay | Admitting: Women's Health

## 2014-09-28 ENCOUNTER — Ambulatory Visit (INDEPENDENT_AMBULATORY_CARE_PROVIDER_SITE_OTHER): Payer: Commercial Managed Care - PPO | Admitting: Women's Health

## 2014-09-28 VITALS — BP 134/80 | Ht 65.0 in | Wt 156.0 lb

## 2014-09-28 DIAGNOSIS — B009 Herpesviral infection, unspecified: Secondary | ICD-10-CM

## 2014-09-28 DIAGNOSIS — F419 Anxiety disorder, unspecified: Secondary | ICD-10-CM

## 2014-09-28 DIAGNOSIS — Z13220 Encounter for screening for lipoid disorders: Secondary | ICD-10-CM

## 2014-09-28 DIAGNOSIS — Z01411 Encounter for gynecological examination (general) (routine) with abnormal findings: Secondary | ICD-10-CM

## 2014-09-28 LAB — CBC WITH DIFFERENTIAL/PLATELET
BASOS ABS: 0.1 10*3/uL (ref 0.0–0.1)
Basophils Relative: 1 % (ref 0–1)
Eosinophils Absolute: 0.1 10*3/uL (ref 0.0–0.7)
Eosinophils Relative: 2 % (ref 0–5)
HCT: 40.9 % (ref 36.0–46.0)
Hemoglobin: 14.3 g/dL (ref 12.0–15.0)
Lymphocytes Relative: 38 % (ref 12–46)
Lymphs Abs: 2.2 10*3/uL (ref 0.7–4.0)
MCH: 30.4 pg (ref 26.0–34.0)
MCHC: 35 g/dL (ref 30.0–36.0)
MCV: 86.8 fL (ref 78.0–100.0)
MONO ABS: 0.4 10*3/uL (ref 0.1–1.0)
MONOS PCT: 7 % (ref 3–12)
NEUTROS ABS: 3 10*3/uL (ref 1.7–7.7)
Neutrophils Relative %: 52 % (ref 43–77)
Platelets: 268 10*3/uL (ref 150–400)
RBC: 4.71 MIL/uL (ref 3.87–5.11)
RDW: 13.4 % (ref 11.5–15.5)
WBC: 5.8 10*3/uL (ref 4.0–10.5)

## 2014-09-28 MED ORDER — ALPRAZOLAM 0.25 MG PO TABS: 0.2500 mg | ORAL_TABLET | Freq: Every evening | ORAL | Status: DC | PRN

## 2014-09-28 MED ORDER — VALACYCLOVIR HCL 500 MG PO TABS: ORAL_TABLET | ORAL | Status: DC

## 2014-09-28 NOTE — Progress Notes (Signed)
RIM THATCH [birth year] [id number]    History:    Presents for annual exam.  Postmenopausal/no bleeding/no HRT. Normal Pap and mammogram history.Osteopenia, 2014 DEXA T score -2.2 left femoral neck FRAX 30%/2.8%, history of left foot fracture with a traumatic fall. Vitamin D level normal. Negative colonoscopy 2014. Rare HSV. Anxiety and depression managed by primary care.  Past medical history, past surgical history, family history and social history were all reviewed and documented in the EPIC chart. Works at [employer]. Husband prostate cancer with metastasis is in the planning/treatment stage.   ROS:  A  12 point ROS was performed and pertinent positives and negatives are included.  Exam:  Filed Vitals:   09/28/14 1604  BP: 134/80    General appearance:  Normal Thyroid:  Symmetrical, normal in size, without palpable masses or nodularity. Respiratory  Auscultation:  Clear without wheezing or rhonchi Cardiovascular  Auscultation:  Regular rate, without rubs, murmurs or gallops  Edema/varicosities:  Not grossly evident Abdominal  Soft,nontender, without masses, guarding or rebound.  Liver/spleen:  No organomegaly noted  Hernia:  None appreciated  Skin  Inspection:  Grossly normal   Breasts: Examined lying and sitting.     Right: Without masses, retractions, discharge or axillary adenopathy.     Left: Without masses, retractions, discharge or axillary adenopathy. Gentitourinary   Inguinal/mons:  Normal without inguinal adenopathy  External genitalia:  Normal  BUS/Urethra/Skene's glands:  Normal  Vagina:  Normal  Cervix:  Normal  Uterus:  normal in size, shape and contour.  Midline and mobile  Adnexa/parametria:     Rt: Without masses or tenderness.   Lt: Without masses or tenderness.  Anus and perineum: Normal  Digital rectal exam: Normal sphincter tone without palpated masses or tenderness  Assessment/Plan:  58 y.o. MWF G3P1 for annual exam.   Postmenopausal  /no bleeding/no HRT Osteopenia on no medication Rare HSV Situational stress/husbands health  Plan: Home safety, fall prevention and importance of regular weightbearing exercise reviewed. SBE's, continue annual mammogram, calcium rich diet, vitamin D 1000 daily encouraged. CBC, CMP, lipid panel, UA, Pap normal 2014, new screening guidelines reviewed. Valtrex 500 twice daily for 3-5 days as needed, prescription, proper use given and reviewed. Xanax 0.25 to use sparingly as needed prescription, proper use given and reviewed.     [place], 5:02 PM 09/28/2014

## 2014-09-28 NOTE — Patient Instructions (Signed)
Health Recommendations for Postmenopausal Women Respected and ongoing research has looked at the most common causes of death, disability, and poor quality of life in postmenopausal women. The causes include heart disease, diseases of blood vessels, diabetes, depression, cancer, and bone loss (osteoporosis). Many things can be done to help lower the chances of developing these and other common problems. CARDIOVASCULAR DISEASE Heart Disease: A heart attack is a medical emergency. Know the signs and symptoms of a heart attack. Below are things women can do to reduce their risk for heart disease.   Do not smoke. If you smoke, quit.  Aim for a healthy weight. Being overweight causes many preventable deaths. Eat a healthy and balanced diet and drink an adequate amount of liquids.  Get moving. Make a commitment to be more physically active. Aim for 30 minutes of activity on most, if not all days of the week.  Eat for heart health. Choose a diet that is low in saturated fat and cholesterol and eliminate trans fat. Include whole grains, vegetables, and fruits. Read and understand the labels on food containers before buying.  Know your numbers. Ask your caregiver to check your blood pressure, cholesterol (total, HDL, LDL, triglycerides) and blood glucose. Work with your caregiver on improving your entire clinical picture.  High blood pressure. Limit or stop your table salt intake (try salt substitute and food seasonings). Avoid salty foods and drinks. Read labels on food containers before buying. Eating well and exercising can help control high blood pressure. STROKE  Stroke is a medical emergency. Stroke may be the result of a blood clot in a blood vessel in the brain or by a brain hemorrhage (bleeding). Know the signs and symptoms of a stroke. To lower the risk of developing a stroke:  Avoid fatty foods.  Quit smoking.  Control your diabetes, blood pressure, and irregular heart rate. THROMBOPHLEBITIS  (BLOOD CLOT) OF THE LEG  Becoming overweight and leading a stationary lifestyle may also contribute to developing blood clots. Controlling your diet and exercising will help lower the risk of developing blood clots. CANCER SCREENING  Breast Cancer: Take steps to reduce your risk of breast cancer.  You should practice "breast self-awareness." This means understanding the normal appearance and feel of your breasts and should include breast self-examination. Any changes detected, no matter how small, should be reported to your caregiver.  After age 40, you should have a clinical breast exam (CBE) every year.  Starting at age 40, you should consider having a mammogram (breast X-ray) every year.  If you have a family history of breast cancer, talk to your caregiver about genetic screening.  If you are at high risk for breast cancer, talk to your caregiver about having an MRI and a mammogram every year.  Intestinal or Stomach Cancer: Tests to consider are a rectal exam, fecal occult blood, sigmoidoscopy, and colonoscopy. Women who are high risk may need to be screened at an earlier age and more often.  Cervical Cancer:  Beginning at age 30, you should have a Pap test every 3 years as long as the past 3 Pap tests have been normal.  If you have had past treatment for cervical cancer or a condition that could lead to cancer, you need Pap tests and screening for cancer for at least 20 years after your treatment.  If you had a hysterectomy for a problem that was not cancer or a condition that could lead to cancer, then you no longer need Pap tests.    If you are between ages 65 and 70, and you have had normal Pap tests going back 10 years, you no longer need Pap tests.  If Pap tests have been discontinued, risk factors (such as a new sexual partner) need to be reassessed to determine if screening should be resumed.  Some medical problems can increase the chance of getting cervical cancer. In these  cases, your caregiver may recommend more frequent screening and Pap tests.  Uterine Cancer: If you have vaginal bleeding after reaching menopause, you should notify your caregiver.  Ovarian Cancer: Other than yearly pelvic exams, there are no reliable tests available to screen for ovarian cancer at this time except for yearly pelvic exams.  Lung Cancer: Yearly chest X-rays can detect lung cancer and should be done on high risk women, such as cigarette smokers and women with chronic lung disease (emphysema).  Skin Cancer: A complete body skin exam should be done at your yearly examination. Avoid overexposure to the sun and ultraviolet light lamps. Use a strong sun block cream when in the sun. All of these things are important for lowering the risk of skin cancer. MENOPAUSE Menopause Symptoms: Hormone therapy products are effective for treating symptoms associated with menopause:  Moderate to severe hot flashes.  Night sweats.  Mood swings.  Headaches.  Tiredness.  Loss of sex drive.  Insomnia.  Other symptoms. Hormone replacement carries certain risks, especially in older women. Women who use or are thinking about using estrogen or estrogen with progestin treatments should discuss that with their caregiver. Your caregiver will help you understand the benefits and risks. The ideal dose of hormone replacement therapy is not known. The Food and Drug Administration (FDA) has concluded that hormone therapy should be used only at the lowest doses and for the shortest amount of time to reach treatment goals.  OSTEOPOROSIS Protecting Against Bone Loss and Preventing Fracture If you use hormone therapy for prevention of bone loss (osteoporosis), the risks for bone loss must outweigh the risk of the therapy. Ask your caregiver about other medications known to be safe and effective for preventing bone loss and fractures. To guard against bone loss or fractures, the following is recommended:  If  you are younger than age 50, take 1000 mg of calcium and at least 600 mg of Vitamin D per day.  If you are older than age 50 but younger than age 70, take 1200 mg of calcium and at least 600 mg of Vitamin D per day.  If you are older than age 70, take 1200 mg of calcium and at least 800 mg of Vitamin D per day. Smoking and excessive alcohol intake increases the risk of osteoporosis. Eat foods rich in calcium and vitamin D and do weight bearing exercises several times a week as your caregiver suggests. DIABETES Diabetes Mellitus: If you have type I or type 2 diabetes, you should keep your blood sugar under control with diet, exercise, and recommended medication. Avoid starchy and fatty foods, and too many sweets. Being overweight can make diabetes control more difficult. COGNITION AND MEMORY Cognition and Memory: Menopausal hormone therapy is not recommended for the prevention of cognitive disorders such as Alzheimer's disease or memory loss.  DEPRESSION  Depression may occur at any age, but it is common in elderly women. This may be because of physical, medical, social (loneliness), or financial problems and needs. If you are experiencing depression because of medical problems and control of symptoms, talk to your caregiver about this. Physical   activity and exercise may help with mood and sleep. Community and volunteer involvement may improve your sense of value and worth. If you have depression and you feel that the problem is getting worse or becoming severe, talk to your caregiver about which treatment options are best for you. ACCIDENTS  Accidents are common and can be serious in elderly woman. Prepare your house to prevent accidents. Eliminate throw rugs, place hand bars in bath, shower, and toilet areas. Avoid wearing high heeled shoes or walking on wet, snowy, and icy areas. Limit or stop driving if you have vision or hearing problems, or if you feel you are unsteady with your movements and  reflexes. HEPATITIS C Hepatitis C is a type of viral infection affecting the liver. It is spread mainly through contact with blood from an infected person. It can be treated, but if left untreated, it can lead to severe liver damage over the years. Many people who are infected do not know that the virus is in their blood. If you are a "baby-boomer", it is recommended that you have one screening test for Hepatitis C. IMMUNIZATIONS  Several immunizations are important to consider having during your senior years, including:   Tetanus, diphtheria, and pertussis booster shot.  Influenza every year before the flu season begins.  Pneumonia vaccine.  Shingles vaccine.  Others, as indicated based on your specific needs. Talk to your caregiver about these. Document Released: 01/01/2006 Document Revised: 03/26/2014 Document Reviewed: 08/27/2008 ExitCare Patient Information 2015 [employer]. This information is not intended to replace advice given to you by your health care provider. Make sure you discuss any questions you have with your health care provider.  

## 2014-09-29 LAB — URINALYSIS W MICROSCOPIC + REFLEX CULTURE
Bacteria, UA: NONE SEEN
Bilirubin Urine: NEGATIVE
CASTS: NONE SEEN
Crystals: NONE SEEN
GLUCOSE, UA: NEGATIVE mg/dL
Hgb urine dipstick: NEGATIVE
Ketones, ur: NEGATIVE mg/dL
LEUKOCYTES UA: NEGATIVE
Nitrite: NEGATIVE
PH: 6 (ref 5.0–8.0)
Protein, ur: NEGATIVE mg/dL
SQUAMOUS EPITHELIAL / LPF: NONE SEEN
Specific Gravity, Urine: 1.006 (ref 1.005–1.030)
Urobilinogen, UA: 0.2 mg/dL (ref 0.0–1.0)

## 2014-09-29 LAB — LIPID PANEL
Cholesterol: 204 mg/dL — ABNORMAL HIGH (ref 0–200)
HDL: 71 mg/dL (ref >39)
LDL Cholesterol: 115 mg/dL — ABNORMAL HIGH (ref 0–99)
Total CHOL/HDL Ratio: 2.9 Ratio
Triglycerides: 91 mg/dL (ref <150)
VLDL: 18 mg/dL (ref 0–40)

## 2014-09-29 LAB — COMPREHENSIVE METABOLIC PANEL
ALT: 17 U/L (ref 0–35)
AST: 17 U/L (ref 0–37)
Albumin: 4.3 g/dL (ref 3.5–5.2)
Alkaline Phosphatase: 56 U/L (ref 39–117)
BUN: 9 mg/dL (ref 6–23)
CALCIUM: 9.1 mg/dL (ref 8.4–10.5)
CHLORIDE: 103 meq/L (ref 96–112)
CO2: 25 mEq/L (ref 19–32)
Creat: 0.81 mg/dL (ref 0.50–1.10)
Glucose, Bld: 93 mg/dL (ref 70–99)
POTASSIUM: 3.7 meq/L (ref 3.5–5.3)
Sodium: 138 mEq/L (ref 135–145)
Total Bilirubin: 0.3 mg/dL (ref 0.2–1.2)
Total Protein: 6.9 g/dL (ref 6.0–8.3)

## 2014-09-30 ENCOUNTER — Encounter: Payer: Self-pay | Admitting: Women's Health

## 2015-05-29 ENCOUNTER — Encounter: Payer: Self-pay | Admitting: Women's Health

## 2015-08-20 ENCOUNTER — Ambulatory Visit: Payer: Commercial Managed Care - PPO | Attending: Sports Medicine | Admitting: Physical Therapy

## 2015-08-20 DIAGNOSIS — M25552 Pain in left hip: Secondary | ICD-10-CM | POA: Diagnosis present

## 2015-08-20 DIAGNOSIS — M7072 Other bursitis of hip, left hip: Secondary | ICD-10-CM | POA: Insufficient documentation

## 2015-08-20 DIAGNOSIS — M6281 Muscle weakness (generalized): Secondary | ICD-10-CM | POA: Diagnosis present

## 2015-08-20 NOTE — Patient Instructions (Signed)
Single leg standing in front of the mirror;  Keep level   Abduction: Clam (Eccentric) - Side-Lying   Lie on side with knees bent. Lift top knee, keeping feet together. Keep trunk steady. Slowly lower for 3-5 seconds. __15_ reps per set, _1__ sets per day, _7__ days per week. Add ___ lbs when you achieve ___ repetitions.  Copyright  VHI. All rights reserved.

## 2015-08-20 NOTE — Therapy (Signed)
[place], [place], [address] Phone: [phone]   Fax:  [phone]  Physical Therapy Evaluation  Patient Details  Name: Kimberly Hale MRN: [id number] Date of Birth: [birth year] Referring Provider:  Pedro Earls, MD  Encounter Date: 08/20/2015      PT End of Session - 08/20/15 1706    Visit Number 1   Number of Visits 16   Date for PT Re-Evaluation 10/15/15   Authorization Type U[employer]   PT Start Time 1625   PT Stop Time 1245   PT Time Calculation (min) 50 min   Activity Tolerance Patient tolerated treatment well      Past Medical History  Diagnosis Date  . Rosacea   . Anxiety   . Environmental allergies   . Osteopenia 10/2012    T score -2.2 FRAX 19%/1.6%  . History of ETOH abuse     NONE IN 10 YEARS    Past Surgical History  Procedure Laterality Date  . Bunionectomy    . Cesarean section  1988  . Lipoma excision  1987  . Therapeutic abortion      X2  . Cystoscopy  1975    There were no vitals filed for this visit.  Visit Diagnosis:  Hip pain, left - Plan: PT plan of care cert/re-cert  Hip bursitis, left - Plan: PT plan of care cert/re-cert  Muscle weakness of lower extremity - Plan: PT plan of care cert/re-cert      Subjective Assessment - 08/20/15 1627    Subjective Left hip pain x 2 years;  Flat foot on right 2 surgeries;  sees chiro weekly;  pain with rising from chair; left side sleeping. Aggravated with cross body movements.  Able to walk dog and clean house.  2 hip cortisone injections but wore off.  Last shot in August which wore off in 3 days.   Pertinent History osteopenia right hip   Limitations House hold activities;Walking;Standing   How long can you sit comfortably? up often    How long can you stand comfortably? depends on surface and footwear   How long can you walk comfortably? 2 miles walking dog   Diagnostic tests MRI; x-ray partial tear tendon gluteus  minimus   Patient Stated Goals get rid of pain   Currently in Pain? Yes   Pain Score 1    Pain Location Hip   Pain Orientation Left   Pain Type Chronic pain   Pain Onset More than a month ago   Pain Frequency Constant   Aggravating Factors  rising; left sidelying   Pain Relieving Factors ice; Advil; massage            [facility] PT Assessment - 08/20/15 1635    Assessment   Medical Diagnosis chronic hip pain bursitis   Onset Date/Surgical Date 02/22/15  2 years   Hand Dominance Right   Next MD Visit Oct 3rd   Prior Therapy no   Precautions   Precautions None   Restrictions   Weight Bearing Restrictions No   Balance Screen   Has the patient fallen in the past 6 months No   Has the patient had a decrease in activity level because of a fear of falling?  No   Is the patient reluctant to leave their home because of a fear of falling?  No   Home Ecologist residence   Living Arrangements Spouse/significant other   Type of [place]  Home Access Stairs to enter   Home Layout Two level   [place] None   Prior Function   Level of Independence Independent   Vocation Full time employment  school admin   Leisure be with dog   Observation/Other Assessments   Lower Extremity Functional Scale  52/80   Posture/Postural Control   Posture Comments lateral trunk lean with left single leg stand  left dip in gluteal soft tissue left > right   ROM / Strength   AROM / PROM / Strength AROM;Strength   AROM   AROM Assessment Site Hip   Right/Left Hip Right;Left   Right Hip Extension 10   Right Hip External Rotation  45   Right Hip Internal Rotation  20   Left Hip Extension 10   Left Hip External Rotation  40   Left Hip Internal Rotation  15   Strength   Strength Assessment Site Hip;Lumbar   Right/Left Hip Right;Left   Right Hip Extension 4+/5   Right Hip ABduction 4+/5   Left Hip Extension 4/5   Left Hip ABduction 4/5   Lumbar Flexion 4/5    Lumbar Extension 4/5   Flexibility   Soft Tissue Assessment /Muscle Length yes   Hamstrings B   Quadriceps Bilateral   Palpation   Palpation comment Mild tenderness left gluteals                           PT Education - 08/20/15 1705    Education provided Yes   Education Details clams, single leg in front of mirror. avoid left sidelying; dry needle info   Person(s) Educated Patient   Methods Explanation;Demonstration;Handout   Comprehension Verbalized understanding;Returned demonstration          PT Short Term Goals - 08/20/15 2051    PT SHORT TERM GOAL #1   Title Patient will have basic knowledge of self care, use of heat/cold, initial exercises for pain management   Time 4   Period Weeks   Status New   PT SHORT TERM GOAL #2   Title Patient will report a 25% improvement in pain and function   Time 4   Period Weeks   Status New   PT SHORT TERM GOAL #3   Title Patient will have improved core and hip muscle activation to assist/give greater ease with rolling over in bed and sit to stand   Time 4   Period Weeks   Status New           PT Long Term Goals - 08/20/15 2053    PT LONG TERM GOAL #1   Title Patient will be independent in safe, self progression of HEP   Time 8   Period Weeks   Status New   PT LONG TERM GOAL #2   Title Patient will have improved left hip strength and lumbar/core strength to 4+/5 needed for standing longer periods of time and greater ease with sit to stand   Time 8   Period Weeks   Status New   PT LONG TERM GOAL #3   Title Overall improvement in pain and function > 50%   Time 8   Period Weeks   Status New   PT LONG TERM GOAL #4   Title LEFS functional outcome score improved from 52/80 to 60/80 indicating improved function with ADLs   Time 8   Period Weeks   Status New  Plan - 08/20/15 2045    Clinical Impression Statement The patient is a 59 year old female with a 2 year history of left  lateral hip and buttock pain.  Symptoms began for no apparent reason but patient states it may be the result of right flat foot and 2 foot surgeries.  She states MRI showed a "tear in her gluteus minimus".  Her pain increases with rising sit to stand and left sidelying.  She had 2 cortisone injections with short term relief only.  Her AROM of left hip with min limitations only with hip IR and ER.  Discomfort with right prone knee bend with left side symptoms and mild discomfort with FABER.  Decreased left hip strength 4/5 and decreased core strength 4/5.  Tender gluteals and piriformis region.  Dip in gluteal muscle mass on left.  Lateral trunk lean with single leg standing.  She would benefit from PT to address these deficits.     Pt will benefit from skilled therapeutic intervention in order to improve on the following deficits Pain;Increased fascial restricitons;Impaired flexibility;Decreased strength;Decreased range of motion   Rehab Potential Good   PT Frequency 2x / week   PT Duration 8 weeks   PT Treatment/Interventions ADLs/Self Care Home Management;Electrical Stimulation;Cryotherapy;Iontophoresis 4mg /ml Dexamethasone;Therapeutic exercise;Ultrasound;Patient/family education;Manual techniques;Taping;Dry needling   PT Next Visit Plan Dry needling gluteals, piriformis, TFL;  assess response to clams and single leg standing;   add abdominal brace series; heat or other modalities as needed         Problem List Patient Active Problem List   Diagnosis Date Noted  . Osteopenia 09/09/2012  . Rosacea 09/09/2012  . HSV infection 09/09/2012    Alvera Singh 08/20/2015, 8:57 PM  [facility] [address] [place], [place], [address] Phone: [phone]   Fax:  [phone]  Ruben Im, PT 08/20/2015 8:58 PM Phone: [phone] Fax: [phone]

## 2015-09-02 ENCOUNTER — Ambulatory Visit: Payer: Commercial Managed Care - PPO | Attending: Sports Medicine | Admitting: Physical Therapy

## 2015-09-02 DIAGNOSIS — M6281 Muscle weakness (generalized): Secondary | ICD-10-CM | POA: Diagnosis present

## 2015-09-02 DIAGNOSIS — M7072 Other bursitis of hip, left hip: Secondary | ICD-10-CM | POA: Diagnosis present

## 2015-09-02 DIAGNOSIS — M25552 Pain in left hip: Secondary | ICD-10-CM

## 2015-09-02 NOTE — Therapy (Signed)
[place], [place], [address] Phone: [phone]   Fax:  [phone]  Physical Therapy Treatment  Patient Details  Name: Kimberly Hale MRN: [id number] Date of Birth: February 28, 1956 Referring Provider:  Pedro Earls, MD  Encounter Date: 09/02/2015      PT End of Session - 09/02/15 1746    Visit Number 2   Number of Visits 16   Date for PT Re-Evaluation 10/15/15   PT Start Time 1620   PT Stop Time 1884   PT Time Calculation (min) 25 min   Activity Tolerance Patient tolerated treatment well;No increased pain   Behavior During Therapy [facility] for tasks assessed/performed      Past Medical History  Diagnosis Date  . Rosacea   . Anxiety   . Environmental allergies   . Osteopenia 10/2012    T score -2.2 FRAX 19%/1.6%  . History of ETOH abuse     NONE IN 10 YEARS    Past Surgical History  Procedure Laterality Date  . Bunionectomy    . Cesarean section  1988  . Lipoma excision  1987  . Therapeutic abortion      X2  . Cystoscopy  1975    There were no vitals filed for this visit.  Visit Diagnosis:  Hip pain, left  Hip bursitis, left  Muscle weakness of lower extremity      Subjective Assessment - 09/02/15 1743    Subjective Pain is the same as last visit.   Pain Frequency Constant   Aggravating Factors  rising, left sidelying   Pain Relieving Factors cold rest, body pillow for positioning                         [facility] Adult PT Treatment/Exercise - 09/02/15 0001    Lumbar Exercises: Supine   Other Supine Lumbar Exercises lumbar stabilization brace, clams and march and leg slides with cues  10 X each                PT Education - 09/02/15 1455    Education provided Yes   Education Details transversus abdominius, lumbar stabilization 1.   Person(s) Educated Patient   Methods Explanation;Demonstration;Tactile cues;Verbal cues;Handout   Comprehension  Verbalized understanding;Returned demonstration          PT Short Term Goals - 09/02/15 1750    PT SHORT TERM GOAL #1   Title Patient will have basic knowledge of self care, use of heat/cold, initial exercises for pain management   Time 4   Period Weeks   Status On-going   PT SHORT TERM GOAL #2   Title Patient will report a 25% improvement in pain and function   Baseline none yet   Time 4   Period Weeks   Status On-going   PT SHORT TERM GOAL #3   Title Patient will have improved core and hip muscle activation to assist/give greater ease with rolling over in bed and sit to stand   Baseline beginner exercises for these   Time 4   Period Weeks   Status On-going           PT Long Term Goals - 08/20/15 2053    PT LONG TERM GOAL #1   Title Patient will be independent in safe, self progression of HEP   Time 8   Period Weeks   Status New   PT LONG TERM GOAL #2   Title Patient will  have improved left hip strength and lumbar/core strength to 4+/5 needed for standing longer periods of time and greater ease with sit to stand   Time 8   Period Weeks   Status New   PT LONG TERM GOAL #3   Title Overall improvement in pain and function > 50%   Time 8   Period Weeks   Status New   PT LONG TERM GOAL #4   Title LEFS functional outcome score improved from 52/80 to 60/80 indicating improved function with ADLs   Time 8   Period Weeks   Status New               Plan - 09/02/15 1747    Clinical Impression Statement Progress toward home exercises.  for core stabilization.     PT Next Visit Plan review stabilization 1 , transversus abdominus.  answer any body mechanics questions.  Look at sit to stand technique.  Manual soft tissue work.     PT Home Exercise Plan Lumbar stabilization 2. Transversus abdominus.     Consulted and Agree with Plan of Care Patient        Problem List Patient Active Problem List   Diagnosis Date Noted  . Osteopenia 09/09/2012  . Rosacea  09/09/2012  . HSV infection 09/09/2012    HARRIS,KAREN 09/02/2015, 5:52 PM  [facility] [address] [place], [place], [address] Phone: [phone]   Fax:  [phone]     [name], PTA 09/02/2015 5:52 PM Phone: [phone] Fax: [phone]

## 2015-09-02 NOTE — Patient Instructions (Signed)

## 2015-09-04 ENCOUNTER — Ambulatory Visit: Payer: Commercial Managed Care - PPO | Admitting: Physical Therapy

## 2015-09-04 DIAGNOSIS — M7072 Other bursitis of hip, left hip: Secondary | ICD-10-CM

## 2015-09-04 DIAGNOSIS — M25552 Pain in left hip: Secondary | ICD-10-CM | POA: Diagnosis not present

## 2015-09-04 DIAGNOSIS — M6281 Muscle weakness (generalized): Secondary | ICD-10-CM

## 2015-09-04 NOTE — Therapy (Signed)
[place], [place], [address] Phone: [phone]   Fax:  [phone]  Physical Therapy Treatment  Patient Details  Name: Kimberly Hale MRN: [id number] Date of Birth: [birth year] Referring Provider:  Maurice Small, MD  Encounter Date: 09/04/2015      PT End of Session - 09/04/15 1510    Visit Number 3   Number of Visits 16   Date for PT Re-Evaluation 10/15/15   PT Start Time 1751   PT Stop Time 1458   PT Time Calculation (min) 43 min   Activity Tolerance Patient tolerated treatment well   Behavior During Therapy [facility] for tasks assessed/performed      Past Medical History  Diagnosis Date  . Rosacea   . Anxiety   . Environmental allergies   . Osteopenia 10/2012    T score -2.2 FRAX 19%/1.6%  . History of ETOH abuse     NONE IN 10 YEARS    Past Surgical History  Procedure Laterality Date  . Bunionectomy    . Cesarean section  1988  . Lipoma excision  1987  . Therapeutic abortion      X2  . Cystoscopy  1975    There were no vitals filed for this visit.  Visit Diagnosis:  Hip pain, left  Hip bursitis, left  Muscle weakness of lower extremity      Subjective Assessment - 09/04/15 1500    Subjective Has a Headach. Hip hurts almost every time she gets up out of her chair.  Her desk and chair are oriented to the RT so she tends to push more with the left leg.   Currently in Pain? Yes   Pain Score 1    Pain Location Hip   Pain Orientation Left   Pain Descriptors / Indicators --  Hurts                         [facility] Adult PT Treatment/Exercise - 09/04/15 1415    Manual Therapy   Manual Therapy Edema management;Myofascial release   Manual therapy comments Lt hip manual and with ROCK BLADE Tissue softened and pain reduced.   Edema Management localized distal greater trocanter   Myofascial Release with ROCK tool LT hip IT band                  PT Short  Term Goals - 09/02/15 1750    PT SHORT TERM GOAL #1   Title Patient will have basic knowledge of self care, use of heat/cold, initial exercises for pain management   Time 4   Period Weeks   Status On-going   PT SHORT TERM GOAL #2   Title Patient will report a 25% improvement in pain and function   Baseline none yet   Time 4   Period Weeks   Status On-going   PT SHORT TERM GOAL #3   Title Patient will have improved core and hip muscle activation to assist/give greater ease with rolling over in bed and sit to stand   Baseline beginner exercises for these   Time 4   Period Weeks   Status On-going           PT Long Term Goals - 08/20/15 2053    PT LONG TERM GOAL #1   Title Patient will be independent in safe, self progression of HEP   Time 8   Period Weeks   Status New   PT  LONG TERM GOAL #2   Title Patient will have improved left hip strength and lumbar/core strength to 4+/5 needed for standing longer periods of time and greater ease with sit to stand   Time 8   Period Weeks   Status New   PT LONG TERM GOAL #3   Title Overall improvement in pain and function > 50%   Time 8   Period Weeks   Status New   PT LONG TERM GOAL #4   Title LEFS functional outcome score improved from 52/80 to 60/80 indicating improved function with ADLs   Time 8   Period Weeks   Status New               Plan - 09/04/15 1510    Clinical Impression Statement Manual only today.  Patient had no questions about stabilization.  Patient had a headach made worse from childern in waiting room.  So I decided manual would help the most.  Pain improved, tissue softened.  Ionto order not yet back from MD>.   PT Next Visit Plan Dry Needle, look at sit to stand technique.   PT Home Exercise Plan Lumbar stabilization 2. Transversus abdominus.     Consulted and Agree with Plan of Care Patient        Problem List Patient Active Problem List   Diagnosis Date Noted  . Osteopenia 09/09/2012  .  Rosacea 09/09/2012  . HSV infection 09/09/2012    HARRIS,KAREN 09/04/2015, 3:15 PM  [facility] [address] [place], [place], [address] Phone: [phone]   Fax:  [phone]     [name], PTA 09/04/2015 3:15 PM Phone: [phone] Fax: [phone]

## 2015-09-09 ENCOUNTER — Ambulatory Visit: Payer: Commercial Managed Care - PPO | Admitting: Physical Therapy

## 2015-09-09 DIAGNOSIS — M6281 Muscle weakness (generalized): Secondary | ICD-10-CM

## 2015-09-09 DIAGNOSIS — M25552 Pain in left hip: Secondary | ICD-10-CM

## 2015-09-09 DIAGNOSIS — M7072 Other bursitis of hip, left hip: Secondary | ICD-10-CM

## 2015-09-09 NOTE — Therapy (Addendum)
[place], [place], [address] Phone: [phone]   Fax:  [phone]  Physical Therapy Treatment  Patient Details  Name: Kimberly Hale MRN: [id number] Date of Birth: 07/13/1956 Referring Provider: Maurice Small, MD  Encounter Date: 09/09/2015      Kimberly Hale End of Session - 09/09/15 1511    Visit Number 4   Number of Visits 16   Date for Kimberly Hale Re-Evaluation 10/15/15   Authorization Type U[employer]   Kimberly Hale Start Time 0219   Kimberly Hale Stop Time 0312   Kimberly Hale Time Calculation (min) 53 min   Activity Tolerance Patient tolerated treatment well   Behavior During Therapy [facility] for tasks assessed/performed      Past Medical History  Diagnosis Date  . Rosacea   . Anxiety   . Environmental allergies   . Osteopenia 10/2012    T score -2.2 FRAX 19%/1.6%  . History of ETOH abuse     NONE IN 10 YEARS    Past Surgical History  Procedure Laterality Date  . Bunionectomy    . Cesarean section  1988  . Lipoma excision  1987  . Therapeutic abortion      X2  . Cystoscopy  1975    There were no vitals filed for this visit.  Visit Diagnosis:  Hip pain, left  Hip bursitis, left  Muscle weakness of lower extremity      Subjective Assessment - 09/09/15 1422    Subjective It  is a Monday at work and Kimberly Hale also reports having a headache today.   Currently in Pain? Yes   Pain Score 5    Pain Location Hip   Pain Orientation Left   Pain Descriptors / Indicators Aching;Sore   Pain Type Chronic pain   Pain Onset More than a month ago   Pain Frequency Intermittent   Aggravating Factors  rising from sitting. left side  very painful in the morning   Pain Relieving Factors cold rest, body pilllow for positioning            [facility] Kimberly Hale Assessment - 09/09/15 1421    Assessment   Referring Provider Maurice Small, MD                     [facility] Adult Kimberly Hale Treatment/Exercise - 09/09/15 1509    Lumbar Exercises: Stretches   Piriformis Stretch 2 reps;60 seconds  sitting and supine x 2 each on left   Piriformis Stretch Limitations Kimberly Hale repeated piriformis stretch in supine post TDN   Knee/Hip Exercises: Stretches   Other Knee/Hip Stretches IT band stretch in supine 30 sec hold in supine   Moist Heat Therapy   Number Minutes Moist Heat 10 Minutes   Moist Heat Location Hip  left side in Right side lying   Manual Therapy   Soft tissue mobilization STW to left gluteals and piriformis   Myofascial Release MFR in prone to piriformis with Left Hip IR/ER movement and myofascial hold Left piriformis          Trigger Point Dry Needling - 09/09/15 1425    Consent Given? Yes   Education Handout Provided Yes   Muscles Treated Upper Body Gluteus minimus;Gluteus maximus;Piriformis   Muscles Treated Lower Body Gluteus minimus;Gluteus maximus;Piriformis   Gluteus Maximus Response Twitch response elicited;Palpable increased muscle length  left side only for TDN   Gluteus Minimus Response Twitch response elicited;Palpable increased muscle length   Piriformis Response Twitch response elicited;Palpable increased muscle length  marked response              Kimberly Hale Education - 09/09/15 1433    Education provided Yes   Education Details Trigger point dry needling precautions and aftercare. and Piriformis stretch. use of tennis ball for myofascial stretch.   Person(s) Educated Patient   Methods Explanation;Demonstration;Tactile cues;Verbal cues;Handout   Comprehension Verbalized understanding;Returned demonstration          Kimberly Hale Short Term Goals - 09/02/15 1750    Kimberly Hale SHORT TERM GOAL #1   Title Patient will have basic knowledge of self care, use of heat/cold, initial exercises for pain management   Time 4   Period Weeks   Status On-going   Kimberly Hale SHORT TERM GOAL #2   Title Patient will report a 25% improvement in pain and function   Baseline none yet   Time 4   Period Weeks   Status On-going   Kimberly Hale SHORT TERM GOAL #3    Title Patient will have improved core and hip muscle activation to assist/give greater ease with rolling over in bed and sit to stand   Baseline beginner exercises for these   Time 4   Period Weeks   Status On-going           Kimberly Hale Long Term Goals - 08/20/15 2053    Kimberly Hale LONG TERM GOAL #1   Title Patient will be independent in safe, self progression of HEP   Time 8   Period Weeks   Status New   Kimberly Hale LONG TERM GOAL #2   Title Patient will have improved left hip strength and lumbar/core strength to 4+/5 needed for standing longer periods of time and greater ease with sit to stand   Time 8   Period Weeks   Status New   Kimberly Hale LONG TERM GOAL #3   Title Overall improvement in pain and function > 50%   Time 8   Period Weeks   Status New   Kimberly Hale LONG TERM GOAL #4   Title LEFS functional outcome score improved from 52/80 to 60/80 indicating improved function with ADLs   Time 8   Period Weeks   Status New               Plan - 09/09/15 1511    Clinical Impression Statement Kimberly Hale is still having pain in left hip and had headache today.  Kimberly Hale uses body pillow at home but still has pain in Left hip when morning arrives. Ionto orders returned from MD. but Kimberly Hale chooses to try Trigger point dry needling ans consents to treatment.     Kimberly Hale will benefit from skilled therapeutic intervention in order to improve on the following deficits Pain;Increased fascial restricitons;Impaired flexibility;Decreased strength;Decreased range of motion   Rehab Potential Good   Kimberly Hale Frequency 2x / week   Kimberly Hale Duration 8 weeks   Kimberly Hale Treatment/Interventions ADLs/Self Care Home Management;Electrical Stimulation;Cryotherapy;Iontophoresis 4mg /ml Dexamethasone;Therapeutic exercise;Ultrasound;Patient/family education;Manual techniques;Taping;Dry needling   Kimberly Hale Next Visit Plan assess dry needling benefit.  Kimberly Hale may need Ionto patch which has been approved by MD Assess goals next visit   Kimberly Hale Home Exercise Plan Piriformis stretch and IT band  stretch   Consulted and Agree with Plan of Care Patient        Problem List Patient Active Problem List   Diagnosis Date Noted  . Osteopenia 09/09/2012  . Rosacea 09/09/2012  . HSV infection 09/09/2012   Kimberly Hale, Kimberly Hale 09/09/2015 3:37 PM Phone: [phone] Fax: [place], [place], [address] Phone: [phone]   Fax:  [phone]  Name: Kimberly Hale MRN: [id number] Date of Birth: 1956-11-06

## 2015-09-09 NOTE — Patient Instructions (Signed)
Trigger Point Dry Needling  . What is Trigger Point Dry Needling (DN)? o DN is a physical therapy technique used to treat muscle pain and dysfunction. Specifically, DN helps deactivate muscle trigger points (muscle knots).  o A thin filiform needle is used to penetrate the skin and stimulate the underlying trigger point. The goal is for a local twitch response (LTR) to occur and for the trigger point to relax. No medication of any kind is injected during the procedure.   . What Does Trigger Point Dry Needling Feel Like?  o The procedure feels different for each individual patient. Some patients report that they do not actually feel the needle enter the skin and overall the process is not painful. Very mild bleeding may occur. However, many patients feel a deep cramping in the muscle in which the needle was inserted. This is the local twitch response.   Marland Kitchen How Will I feel after the treatment? o Soreness is normal, and the onset of soreness may not occur for a few hours. Typically this soreness does not last longer than two days.  o Bruising is uncommon, however; ice can be used to decrease any possible bruising.  o In rare cases feeling tired or nauseous after the treatment is normal. In addition, your symptoms may get worse before they get better, this period will typically not last longer than 24 hours.   . What Can I do After My Treatment? o Increase your hydration by drinking more water for the next 24 hours. o You may place ice or heat on the areas treated that have become sore, however, do not use heat on inflamed or bruised areas. Heat often brings more relief post needling. o You can continue your regular activities, but vigorous activity is not recommended initially after the treatment for 24 hours. o DN is best combined with other physical therapy such as strengthening, stretching, and other therapies.   Piriformis (Supine)  Cross legs, right on top. Gently pull other knee toward chest  until stretch is felt in buttock/hip of top leg. Hold _30-60___ seconds. Repeat __2__ times per set. Do __1__ sets per session. Do _3-5___ sessions per day.  Please fold Left leg over right at knee and pull up for gentle stretch. As shown in clinic.  Hip Stretch  Put right ankle over left knee. Let right knee fall downward, but keep ankle in place. Feel the stretch in hip. May push down gently with hand to feel stretch. Hold __30-60__ seconds while counting out loud. Repeat with other leg. Repeat _2___ times. Do _3-5___ sessions per day.  Voncille Lo, PT 09/09/2015 2:33 PM Phone: [phone] Fax: [phone]

## 2015-09-11 ENCOUNTER — Ambulatory Visit: Payer: Commercial Managed Care - PPO | Admitting: Physical Therapy

## 2015-09-11 DIAGNOSIS — M7072 Other bursitis of hip, left hip: Secondary | ICD-10-CM

## 2015-09-11 DIAGNOSIS — M25552 Pain in left hip: Secondary | ICD-10-CM | POA: Diagnosis not present

## 2015-09-11 DIAGNOSIS — M6281 Muscle weakness (generalized): Secondary | ICD-10-CM

## 2015-09-11 NOTE — Therapy (Signed)
[place], [place], [address] Phone: [phone]   Fax:  [phone]  Physical Therapy Treatment  Patient Details  Name: Kimberly Hale MRN: [id number] Date of Birth: September 11, 1956 Referring Provider: Maurice Small, MD  Encounter Date: 09/11/2015      PT End of Session - 09/11/15 1808    Visit Number 5   Number of Visits 16   Date for PT Re-Evaluation 10/15/15   PT Start Time 1007   PT Stop Time 1500   PT Time Calculation (min) 40 min   Activity Tolerance Patient tolerated treatment well;No increased pain   Behavior During Therapy [facility] for tasks assessed/performed      Past Medical History  Diagnosis Date  . Rosacea   . Anxiety   . Environmental allergies   . Osteopenia 10/2012    T score -2.2 FRAX 19%/1.6%  . History of ETOH abuse     NONE IN 10 YEARS    Past Surgical History  Procedure Laterality Date  . Bunionectomy    . Cesarean section  1988  . Lipoma excision  1987  . Therapeutic abortion      X2  . Cystoscopy  1975    There were no vitals filed for this visit.  Visit Diagnosis:  Hip pain, left  Hip bursitis, left  Muscle weakness of lower extremity      Subjective Assessment - 09/11/15 1420    Subjective Tries to get up out of chair correctly at work using legs equally and using arms.  Dry Needle helped a little.  Manual helped for a day.  Able to exercise and stretch off and on during the day.   Currently in Pain? Yes   Pain Score 5    Pain Location Hip   Pain Orientation Left   Pain Descriptors / Indicators Aching;Sore                         [facility] Adult PT Treatment/Exercise - 09/11/15 0001    Iontophoresis   Type of Iontophoresis Dexamethasone   Location gluteal   Dose 4 mg/ml 1 cc,   Time 8 minutes/ 4 hour patch   Manual Therapy   Soft tissue mobilization Soft tissue work to hip/ gluteal in sidelying using instrument assist (Stone)  tissue  softened, less pain                 PT Education - 09/11/15 1808    Education provided Yes   Education Details ionto info   Person(s) Educated Patient   Methods Explanation;Handout   Comprehension Verbalized understanding          PT Short Term Goals - 09/11/15 1811    PT SHORT TERM GOAL #1   Title Patient will have basic knowledge of self care, use of heat/cold, initial exercises for pain management   Baseline knows stretches   Time 4   Period Weeks   Status Partially Met   PT SHORT TERM GOAL #2   Title Patient will report a 25% improvement in pain and function   Baseline none yet   Time 4   Period Weeks   Status On-going   PT SHORT TERM GOAL #3   Title Patient will have improved core and hip muscle activation to assist/give greater ease with rolling over in bed and sit to stand   Baseline beginner exercises for these   Time 4   Period Weeks  Status On-going           PT Long Term Goals - 08/20/15 2053    PT LONG TERM GOAL #1   Title Patient will be independent in safe, self progression of HEP   Time 8   Period Weeks   Status New   PT LONG TERM GOAL #2   Title Patient will have improved left hip strength and lumbar/core strength to 4+/5 needed for standing longer periods of time and greater ease with sit to stand   Time 8   Period Weeks   Status New   PT LONG TERM GOAL #3   Title Overall improvement in pain and function > 50%   Time 8   Period Weeks   Status New   PT LONG TERM GOAL #4   Title LEFS functional outcome score improved from 52/80 to 60/80 indicating improved function with ADLs   Time 8   Period Weeks   Status New               Plan - 09/11/15 1809    Clinical Impression Statement We haven't "found the silver bullet"  that helps my pain.  Everything is temporary.  May be ready for beginning strengthening.   PT Next Visit Plan Patch if helpful, no side effects.  beginning hip strengthening, nu step, bridges, clams?   PT  Home Exercise Plan Piriformis stretch and IT band stretch   Consulted and Agree with Plan of Care Patient        Problem List Patient Active Problem List   Diagnosis Date Noted  . Osteopenia 09/09/2012  . Rosacea 09/09/2012  . HSV infection 09/09/2012    [name] 09/11/2015, 6:12 PM  [facility] [address] [place], [place], [address] Phone: [phone]   Fax:  [phone]  Name: Kimberly Hale MRN: [id number] Date of Birth: Jan 22, 1956    [name], PTA 09/11/2015 6:12 PM Phone: [phone] Fax: [phone]

## 2015-09-11 NOTE — Patient Instructions (Signed)

## 2015-09-17 ENCOUNTER — Ambulatory Visit: Payer: Commercial Managed Care - PPO | Admitting: Physical Therapy

## 2015-09-17 DIAGNOSIS — M6281 Muscle weakness (generalized): Secondary | ICD-10-CM

## 2015-09-17 DIAGNOSIS — M25552 Pain in left hip: Secondary | ICD-10-CM | POA: Diagnosis not present

## 2015-09-17 NOTE — Therapy (Signed)
[place] [facility] [employer] [place], [place], [address] Phone: [phone]   Fax:  [phone]  Physical Therapy Treatment  Patient Details  Name: Kimberly Hale MRN: [id number] Date of Birth: 07/08/1956 Referring Provider: Maurice Small, MD  Encounter Date: 09/17/2015      PT End of Session - 09/17/15 1831    Visit Number 6   Number of Visits 16   Date for PT Re-Evaluation 10/15/15   Authorization Type UHC   PT Start Time 0045   PT Stop Time 1510   PT Time Calculation (min) 56 min   Activity Tolerance Patient tolerated treatment well      Past Medical History  Diagnosis Date  . Rosacea   . Anxiety   . Environmental allergies   . Osteopenia 10/2012    T score -2.2 FRAX 19%/1.6%  . History of ETOH abuse     NONE IN 10 YEARS    Past Surgical History  Procedure Laterality Date  . Bunionectomy    . Cesarean section  1988  . Lipoma excision  1987  . Therapeutic abortion      X2  . Cystoscopy  1975    There were no vitals filed for this visit.  Visit Diagnosis:  Hip pain, left  Muscle weakness of lower extremity      Subjective Assessment - 09/17/15 1421    Subjective The day after ionto was delightful.  The needles were "interesting", had bruising in the dorsal foot but needling was in buttock.  Still at baseline level.     Currently in Pain? Yes   Pain Score 4    Pain Location Buttocks   Pain Orientation Left   Pain Type Chronic pain   Pain Onset More than a month ago   Pain Frequency Intermittent   Aggravating Factors  rising from sitting;  after sidelying   Pain Relieving Factors combination of treatments                          [facility] Adult PT Treatment/Exercise - 09/17/15 0001    Lumbar Exercises: Aerobic   Stationary Bike Nu-Step L4 5 min   Lumbar Exercises: Supine   Bridge 15 reps   Knee/Hip Exercises: Standing   Hip ADduction Strengthening;Right;Left;1 set;10 reps   Hip ADduction Limitations red band   Hip Extension Stengthening;Right;Left;1 set;10 reps;Knee straight   Extension Limitations red band   Knee/Hip Exercises: Sidelying   Clams with red band 15x R/L   Iontophoresis   Type of Iontophoresis Dexamethasone   Location buttock   Dose 4 mg/ml   Time 8 min   Manual Therapy   Manual therapy comments Left hip long axis distraction, inferior,, AP in IR grade 3 3x 20 sec each   Soft tissue mobilization left gluteals, piriformis          Trigger Point Dry Needling - 09/17/15 1830    Consent Given? Yes   Muscles Treated Lower Body Gluteus minimus;Gluteus maximus;Piriformis   Gluteus Maximus Response Palpable increased muscle length   Gluteus Minimus Response Palpable increased muscle length   Piriformis Response Twitch response elicited;Palpable increased muscle length      Left only        PT Education - 09/17/15 1830    Education provided Yes   Education Details standing red band hip abd, ext; clams with band   Person(s) Educated Patient   Methods Explanation;Demonstration;Handout   Comprehension Verbalized understanding  PT Short Term Goals - 09/17/15 1835    PT SHORT TERM GOAL #1   Title Patient will have basic knowledge of self care, use of heat/cold, initial exercises for pain management   Time 4   Period Weeks   Status Partially Met   PT SHORT TERM GOAL #2   Title Patient will report a 25% improvement in pain and function   Time 4   Period Weeks   Status On-going   PT SHORT TERM GOAL #3   Title Patient will have improved core and hip muscle activation to assist/give greater ease with rolling over in bed and sit to stand   Time 4   Period Weeks   Status On-going           PT Long Term Goals - 09/17/15 1835    PT LONG TERM GOAL #1   Title Patient will be independent in safe, self progression of HEP   Time 8   Period Weeks   Status On-going   PT LONG TERM GOAL #2   Title Patient will have improved  left hip strength and lumbar/core strength to 4+/5 needed for standing longer periods of time and greater ease with sit to stand   Time 8   Period Weeks   Status On-going   PT LONG TERM GOAL #3   Title Overall improvement in pain and function > 50%   Time 8   Period Weeks   Status On-going   PT LONG TERM GOAL #4   Title LEFS functional outcome score improved from 52/80 to 60/80 indicating improved function with ADLs   Time 8   Period Weeks   Status On-going               Plan - 09/17/15 1831    Clinical Impression Statement Patient reports variable status with some good days and bad days.  Today she reports she is about baseline with no noteable improvement or change.  Anticipate slower progress secondary to 2 year history but would expect some progress toward goals within the next 2 visits.  No symptoms produced with strengthening.     PT Next Visit Plan continue ionto trial;  assess response of dry needling #2;  continue gluteal strengthening, Nu-Step;  assess progress toward goals, if none in next 2 visits then discharge        Problem List Patient Active Problem List   Diagnosis Date Noted  . Osteopenia 09/09/2012  . Rosacea 09/09/2012  . HSV infection 09/09/2012    Kimberly Hale 09/17/2015, 6:37 PM  [facility] [address] [place], [place], [address] Phone: [phone]   Fax:  [phone]  Name: Kimberly Hale MRN: [id number] Date of Birth: 1956-09-09  Ruben Im, PT 09/17/2015 6:37 PM Phone: [phone] Fax: [phone]

## 2015-09-19 ENCOUNTER — Ambulatory Visit: Payer: Commercial Managed Care - PPO | Admitting: Physical Therapy

## 2015-09-19 DIAGNOSIS — M6281 Muscle weakness (generalized): Secondary | ICD-10-CM

## 2015-09-19 DIAGNOSIS — M25552 Pain in left hip: Secondary | ICD-10-CM | POA: Diagnosis not present

## 2015-09-19 DIAGNOSIS — M7072 Other bursitis of hip, left hip: Secondary | ICD-10-CM

## 2015-09-20 NOTE — Therapy (Signed)
[place], [place], [address] Phone: [phone]   Fax:  [phone]  Physical Therapy Treatment  Patient Details  Name: Kimberly Hale MRN: [id number] Date of Birth: [birth year] Referring Provider: Maurice Small, MD  Encounter Date: 09/19/2015      PT End of Session - 09/20/15 0733    Visit Number 7   Number of Visits 16   Date for PT Re-Evaluation 10/15/15   Authorization Type UHC   PT Start Time 3545   PT Stop Time 1447   PT Time Calculation (min) 32 min   Activity Tolerance Patient tolerated treatment well      Past Medical History  Diagnosis Date  . Rosacea   . Anxiety   . Environmental allergies   . Osteopenia 10/2012    T score -2.2 FRAX 19%/1.6%  . History of ETOH abuse     NONE IN 10 YEARS    Past Surgical History  Procedure Laterality Date  . Bunionectomy    . Cesarean section  1988  . Lipoma excision  1987  . Therapeutic abortion      X2  . Cystoscopy  1975    There were no vitals filed for this visit.  Visit Diagnosis:  Hip pain, left  Muscle weakness of lower extremity  Hip bursitis, left      Subjective Assessment - 09/19/15 1420    Subjective No changes for the better or worse.  No pain at present but felt it getting up and down from the chair.  Also felt it getting up the steps, it felt like work.     Currently in Pain? No/denies   Pain Score 0-No pain   Pain Location Hip   Pain Orientation Left   Pain Type Chronic pain   Pain Onset More than a month ago   Pain Frequency Intermittent   Aggravating Factors  rising from sitting; up stairs                         [facility] Adult PT Treatment/Exercise - 09/19/15 1444    Lumbar Exercises: Aerobic   Stationary Bike Nu-Step L4 5 min   Knee/Hip Exercises: Standing   Hip Abduction Stengthening;Left;1 set;10 reps   Lateral Step Up Left;2 sets;10 reps;Hand Hold: 1   SLS with UE diagnonals 20x    Iontophoresis   Type of Iontophoresis Dexamethasone   Location buttock L   Dose 4 mg/ml   Time 8   Manual Therapy   Manual Therapy Joint mobilization;Soft tissue mobilization;Taping   Joint Mobilization left long axis distraction, inferior, AP in IR grade 3 ex 30 sec   Soft tissue mobilization left gluteals, piriformis   Kinesiotex Facilitate Muscle  gluteals Y formation          Trigger Point Dry Needling - 09/20/15 0732    Consent Given? Yes   Muscles Treated Lower Body Gluteus maximus;Piriformis   Gluteus Maximus Response Palpable increased muscle length   Piriformis Response Palpable increased muscle length     Left only           PT Short Term Goals - 09/20/15 0740    PT SHORT TERM GOAL #1   Title Patient will have basic knowledge of self care, use of heat/cold, initial exercises for pain management   Time 4   Period Weeks   Status Achieved   PT SHORT TERM GOAL #2   Title Patient will report a  25% improvement in pain and function   Time 4   Period Weeks   Status On-going   PT SHORT TERM GOAL #3   Title Patient will have improved core and hip muscle activation to assist/give greater ease with rolling over in bed and sit to stand   Time 4   Period Weeks   Status On-going           PT Long Term Goals - 09/20/15 0741    PT LONG TERM GOAL #1   Title Patient will be independent in safe, self progression of HEP   Time 8   Period Weeks   Status On-going   PT LONG TERM GOAL #2   Title Patient will have improved left hip strength and lumbar/core strength to 4+/5 needed for standing longer periods of time and greater ease with sit to stand   Time 8   Period Weeks   Status On-going   PT LONG TERM GOAL #3   Title Overall improvement in pain and function > 50%   Time 8   Period Weeks   Status On-going   PT LONG TERM GOAL #4   Title LEFS functional outcome score improved from 52/80 to 60/80 indicating improved function with ADLs   Time 8   Period Weeks    Status On-going               Plan - 09/20/15 0734    Clinical Impression Statement Patient needs to leave early to get back to work.  She reports no consistent improvement.  3rd session of both ionto and dry needling.  Kinesiotape applied for gluteus minimus support.  No pain produced with gluteal strengthening.  Multiple tender points in gluteals.  If no improvement, progress toward goals, will discharge next visit.     PT Next Visit Plan  do LEFS;  if no improvement, discharge;  gluteal strengthening; DN, kinesiotape, ionto        Problem List Patient Active Problem List   Diagnosis Date Noted  . Osteopenia 09/09/2012  . Rosacea 09/09/2012  . HSV infection 09/09/2012    Alvera Singh 09/20/2015, 7:43 AM  [facility] [address] [place], [place], [address] Phone: [phone]   Fax:  [phone]  Name: Kimberly Hale MRN: [id number] Date of Birth: [birth year]   Ruben Im, PT 09/20/2015 7:43 AM Phone: [phone] Fax: [phone]

## 2015-09-23 ENCOUNTER — Encounter: Payer: Commercial Managed Care - PPO | Admitting: Physical Therapy

## 2015-09-26 ENCOUNTER — Ambulatory Visit: Payer: Commercial Managed Care - PPO | Attending: Sports Medicine | Admitting: Physical Therapy

## 2015-09-26 DIAGNOSIS — M25552 Pain in left hip: Secondary | ICD-10-CM

## 2015-09-26 DIAGNOSIS — M6281 Muscle weakness (generalized): Secondary | ICD-10-CM | POA: Diagnosis present

## 2015-09-26 DIAGNOSIS — M7072 Other bursitis of hip, left hip: Secondary | ICD-10-CM | POA: Diagnosis present

## 2015-09-27 NOTE — Therapy (Signed)
[place], [place], [address] Phone: [phone]   Fax:  [phone]  Physical Therapy Treatment/Discharge Summary  Patient Details  Name: Kimberly Hale MRN: [id number] Date of Birth: 04-Oct-1956 Referring Provider: Maurice Small, MD  Encounter Date: 09/26/2015      PT End of Session - 09/27/15 1201    Visit Number 8   Number of Visits 16   Date for PT Re-Evaluation 10/15/15   Authorization Type UHC   PT Start Time 1630   PT Stop Time 1510   PT Time Calculation (min) 1360 min   Activity Tolerance Patient tolerated treatment well      Past Medical History  Diagnosis Date  . Rosacea   . Anxiety   . Environmental allergies   . Osteopenia 10/2012    T score -2.2 FRAX 19%/1.6%  . History of ETOH abuse     NONE IN 10 YEARS    Past Surgical History  Procedure Laterality Date  . Bunionectomy    . Cesarean section  1988  . Lipoma excision  1987  . Therapeutic abortion      X2  . Cystoscopy  1975    There were no vitals filed for this visit.  Visit Diagnosis:  Hip pain, left  Muscle weakness of lower extremity  Hip bursitis, left      Subjective Assessment - 09/26/15 1632    Subjective Today was not great.  Really stiff and sore with getting up and down.  Pain with waking up if left sidelying.  Kept the taping on until Monday morning and it helped some.  No effect from ionto patch.  Reports only marginal improvement overall nothing sustained.  Has appt with surgeon.     Currently in Pain? No/denies   Pain Score 0-No pain   Pain Orientation Left   Aggravating Factors  rising from sitting; up and down stairs (use railings for safety)            [facility] PT Assessment - 09/27/15 0001    AROM   Left Hip External Rotation  45   Left Hip Internal Rotation  15   Strength   Left Hip Extension 4+/5   Left Hip ABduction 4+/5   Flexibility   Hamstrings normal length 90 degrees                      [facility] Adult PT Treatment/Exercise - 09/27/15 0001    Lumbar Exercises: Aerobic   Stationary Bike Nu-Step L4 5 min   Lumbar Exercises: Quadruped   Opposite Arm/Leg Raise Right arm/Left leg;Left arm/Right leg;10 reps   Other Quadruped Lumbar Exercises fire hydrant  10x R/L   Kinesiotix   Facilitate Muscle  Instruct patient in self taping Y formation over gluteus medius and minimus                PT Education - 09/27/15 1200    Education provided Yes   Education Details K taping self   Person(s) Educated Patient   Methods Explanation;Demonstration;Handout   Comprehension Verbalized understanding;Returned demonstration          PT Short Term Goals - 09/27/15 1205    PT SHORT TERM GOAL #1   Title Patient will have basic knowledge of self care, use of heat/cold, initial exercises for pain management   Status Achieved   PT SHORT TERM GOAL #2   Title Patient will report a 25% improvement in pain and function  Status Not Met   PT SHORT TERM GOAL #3   Title Patient will have improved core and hip muscle activation to assist/give greater ease with rolling over in bed and sit to stand   Status Achieved           PT Long Term Goals - 09/27/15 1205    PT LONG TERM GOAL #1   Title Patient will be independent in safe, self progression of HEP   Status Achieved   PT LONG TERM GOAL #2   Title Patient will have improved left hip strength and lumbar/core strength to 4+/5 needed for standing longer periods of time and greater ease with sit to stand   Status Achieved   PT LONG TERM GOAL #3   Title Overall improvement in pain and function > 50%   Status Not Met   PT LONG TERM GOAL #4   Title LEFS functional outcome score improved from 52/80 to 60/80 indicating improved function with ADLs   Status Achieved               Plan - 09/27/15 1201    Clinical Impression Statement The patient reports temporary improvments but  not consistent  improvement.  No changes with dry needling, ionto patch or manual therapy.  She has made improvements in hip ROM and strength.  Her LEFS improved significantly to 68/80. She has been instructed in self taping methods and a HEP progression for further gains in pain reduction.  Discharge from PT at this time with minimal progress toward goals.           PHYSICAL THERAPY DISCHARGE SUMMARY  Visits from Start of Care: 8  Current functional level related to goals / functional outcomes: See clinical impression statement above   Remaining deficits: As above   Education / Equipment: HEP, taping instructions Plan: Patient agrees to discharge.  Patient goals were not met. Patient is being discharged due to lack of progress.  ?????        Problem List Patient Active Problem List   Diagnosis Date Noted  . Osteopenia 09/09/2012  . Rosacea 09/09/2012  . HSV infection 09/09/2012    Alvera Singh 09/27/2015, 12:09 PM  [facility] [address] [place], [place], [address] Phone: [phone]   Fax:  [phone]  Name: Kimberly Hale MRN: [id number] Date of Birth: September 24, 1956   Ruben Im, PT 09/27/2015 12:10 PM Phone: [phone] Fax: [phone]

## 2015-09-27 NOTE — Patient Instructions (Signed)
Hand drawn Kinesiotaping instructions

## 2015-10-01 ENCOUNTER — Ambulatory Visit: Payer: Commercial Managed Care - PPO | Admitting: Physical Therapy

## 2015-10-04 ENCOUNTER — Ambulatory Visit (INDEPENDENT_AMBULATORY_CARE_PROVIDER_SITE_OTHER): Payer: Commercial Managed Care - PPO | Admitting: Women's Health

## 2015-10-04 ENCOUNTER — Encounter: Payer: Self-pay | Admitting: Women's Health

## 2015-10-04 ENCOUNTER — Other Ambulatory Visit (HOSPITAL_COMMUNITY)
Admission: RE | Admit: 2015-10-04 | Discharge: 2015-10-04 | Disposition: A | Discharge status: Home / Self Care | Payer: Commercial Managed Care - PPO | Source: Ambulatory Visit | Attending: Women's Health | Admitting: Women's Health

## 2015-10-04 VITALS — BP 132/80 | Ht 65.0 in | Wt 161.0 lb

## 2015-10-04 DIAGNOSIS — Z01419 Encounter for gynecological examination (general) (routine) without abnormal findings: Secondary | ICD-10-CM

## 2015-10-04 DIAGNOSIS — Z1151 Encounter for screening for human papillomavirus (HPV): Secondary | ICD-10-CM | POA: Insufficient documentation

## 2015-10-04 DIAGNOSIS — Z13820 Encounter for screening for osteoporosis: Secondary | ICD-10-CM | POA: Diagnosis not present

## 2015-10-04 DIAGNOSIS — M8580 Other specified disorders of bone density and structure, unspecified site: Secondary | ICD-10-CM

## 2015-10-04 DIAGNOSIS — Z13220 Encounter for screening for lipoid disorders: Secondary | ICD-10-CM | POA: Diagnosis not present

## 2015-10-04 DIAGNOSIS — B009 Herpesviral infection, unspecified: Secondary | ICD-10-CM

## 2015-10-04 DIAGNOSIS — M899 Disorder of bone, unspecified: Secondary | ICD-10-CM | POA: Diagnosis not present

## 2015-10-04 DIAGNOSIS — F419 Anxiety disorder, unspecified: Secondary | ICD-10-CM

## 2015-10-04 LAB — COMPREHENSIVE METABOLIC PANEL
ALBUMIN: 4.4 g/dL (ref 3.6–5.1)
ALT: 20 U/L (ref 6–29)
AST: 21 U/L (ref 10–35)
Alkaline Phosphatase: 55 U/L (ref 33–130)
BUN: 14 mg/dL (ref 7–25)
CHLORIDE: 101 mmol/L (ref 98–110)
CO2: 25 mmol/L (ref 20–31)
Calcium: 9.5 mg/dL (ref 8.6–10.4)
Creat: 0.85 mg/dL (ref 0.50–0.99)
Glucose, Bld: 84 mg/dL (ref 65–99)
POTASSIUM: 3.9 mmol/L (ref 3.5–5.3)
Sodium: 139 mmol/L (ref 135–146)
TOTAL PROTEIN: 6.7 g/dL (ref 6.1–8.1)
Total Bilirubin: 0.4 mg/dL (ref 0.2–1.2)

## 2015-10-04 LAB — LIPID PANEL
CHOL/HDL RATIO: 2.8 ratio (ref <=5.0)
CHOLESTEROL: 207 mg/dL — AB (ref 125–200)
HDL: 73 mg/dL (ref >=46)
LDL Cholesterol: 114 mg/dL (ref <130)
TRIGLYCERIDES: 100 mg/dL (ref <150)
VLDL: 20 mg/dL (ref <30)

## 2015-10-04 LAB — CBC WITH DIFFERENTIAL/PLATELET
BASOS PCT: 0 % (ref 0–1)
Basophils Absolute: 0 10*3/uL (ref 0.0–0.1)
EOS ABS: 0.1 10*3/uL (ref 0.0–0.7)
Eosinophils Relative: 1 % (ref 0–5)
HCT: 42.8 % (ref 36.0–46.0)
HEMOGLOBIN: 14.6 g/dL (ref 12.0–15.0)
Lymphocytes Relative: 35 % (ref 12–46)
Lymphs Abs: 2.1 10*3/uL (ref 0.7–4.0)
MCH: 30 pg (ref 26.0–34.0)
MCHC: 34.1 g/dL (ref 30.0–36.0)
MCV: 88.1 fL (ref 78.0–100.0)
MPV: 10.5 fL (ref 8.6–12.4)
Monocytes Absolute: 0.5 10*3/uL (ref 0.1–1.0)
Monocytes Relative: 9 % (ref 3–12)
NEUTROS ABS: 3.2 10*3/uL (ref 1.7–7.7)
NEUTROS PCT: 55 % (ref 43–77)
PLATELETS: 246 10*3/uL (ref 150–400)
RBC: 4.86 MIL/uL (ref 3.87–5.11)
RDW: 13.3 % (ref 11.5–15.5)
WBC: 5.9 10*3/uL (ref 4.0–10.5)

## 2015-10-04 MED ORDER — VALACYCLOVIR HCL 500 MG PO TABS: ORAL_TABLET | ORAL | Status: DC

## 2015-10-04 MED ORDER — ALPRAZOLAM 0.25 MG PO TABS
0.2500 mg | ORAL_TABLET | Freq: Every evening | ORAL | Status: DC | PRN
Start: 2015-10-04 — End: 2017-10-19

## 2015-10-04 NOTE — Progress Notes (Signed)
Kimberly Hale [birth year] [id number]    History:    Presents for annual exam.  Postmenopausal/no HRT/no bleeding. Negative colonoscopy 2007 and 2011. 2014 DEXA T score -2.2 left femoral neck FRAX 30%/2.8% declined treatment. Normal Pap and mammogram history. Rare HSV. Anxiety and depression uses rare Xanax, counseling in the past.  Past medical history, past surgical history, family history and social history were all reviewed and documented in the EPIC chart. Works at [employer] in the office. Husband prostate cancer with metastasis doing better. Has 1 son.  ROS:  A ROS was performed and pertinent positives and negatives are included.  Exam:  Filed Vitals:   10/04/15 1056  BP: 132/80    General appearance:  Normal Thyroid:  Symmetrical, normal in size, without palpable masses or nodularity. Respiratory  Auscultation:  Clear without wheezing or rhonchi Cardiovascular  Auscultation:  Regular rate, without rubs, murmurs or gallops  Edema/varicosities:  Not grossly evident Abdominal  Soft,nontender, without masses, guarding or rebound.  Liver/spleen:  No organomegaly noted  Hernia:  None appreciated  Skin  Inspection:  Grossly normal   Breasts: Examined lying and sitting.     Right: Without masses, retractions, discharge or axillary adenopathy.     Left: Without masses, retractions, discharge or axillary adenopathy. Gentitourinary   Inguinal/mons:  Normal without inguinal adenopathy  External genitalia:  Normal  BUS/Urethra/Skene's glands:  Normal  Vagina:  Normal  Cervix:  Normal  Uterus:   normal in size, shape and contour.  Midline and mobile  Adnexa/parametria:     Rt: Without masses or tenderness.   Lt: Without masses or tenderness.  Anus and perineum: Normal  Digital rectal exam: Normal sphincter tone without palpated masses or tenderness  Assessment/Plan:  59 y.o. MWF G1 P1 for annual exam with no complaints.  Postmenopausal/no HRT/no bleeding Osteopenia  with elevated FRAX declined treatment Rare HSV Anxiety and depression occasional Xanax  Plan: Repeat DEXA, home safety, fall prevention and importance of weightbearing exercise reviewed. Valtrex 500 twice daily for 3-5 days as needed prescription given. Xanax 0.25 at bedtime when necessary. Aware of addictive properties uses sparingly. SBE's, continue annual screening mammogram, calcium rich diet, vitamin D 2000 daily encouraged. CBC, CMP, lipid panel, vitamin D, UA, Pap with HR HPV typing, new screening guidelines reviewed.    [place], 12:29 PM 10/04/2015

## 2015-10-04 NOTE — Patient Instructions (Signed)

## 2015-10-05 LAB — URINALYSIS W MICROSCOPIC + REFLEX CULTURE
Bacteria, UA: NONE SEEN [HPF]
Bilirubin Urine: NEGATIVE
Casts: NONE SEEN [LPF]
Crystals: NONE SEEN [HPF]
GLUCOSE, UA: NEGATIVE
Hgb urine dipstick: NEGATIVE
Ketones, ur: NEGATIVE
Leukocytes, UA: NEGATIVE
NITRITE: NEGATIVE
PH: 6.5 (ref 5.0–8.0)
Protein, ur: NEGATIVE
RBC / HPF: NONE SEEN RBC/HPF (ref <=2)
Specific Gravity, Urine: 1.004 (ref 1.001–1.035)
Squamous Epithelial / LPF: NONE SEEN [HPF] (ref <=5)
WBC, UA: NONE SEEN WBC/HPF (ref <=5)
YEAST: NONE SEEN [HPF]

## 2015-10-05 LAB — VITAMIN D 25 HYDROXY (VIT D DEFICIENCY, FRACTURES): Vit D, 25-Hydroxy: 65 ng/mL (ref 30–100)

## 2015-10-06 ENCOUNTER — Encounter: Payer: Self-pay | Admitting: Women's Health

## 2015-10-07 ENCOUNTER — Other Ambulatory Visit: Payer: Self-pay | Admitting: Women's Health

## 2015-10-08 LAB — CYTOLOGY - PAP

## 2015-11-01 ENCOUNTER — Encounter: Payer: Commercial Managed Care - PPO | Admitting: Physical Therapy

## 2015-11-12 ENCOUNTER — Other Ambulatory Visit: Payer: Self-pay | Admitting: Women's Health

## 2015-11-12 ENCOUNTER — Telehealth: Payer: Self-pay | Admitting: Gynecology

## 2015-11-12 ENCOUNTER — Encounter: Payer: Self-pay | Admitting: Gynecology

## 2015-11-12 ENCOUNTER — Ambulatory Visit (INDEPENDENT_AMBULATORY_CARE_PROVIDER_SITE_OTHER): Payer: Commercial Managed Care - PPO

## 2015-11-12 DIAGNOSIS — M8580 Other specified disorders of bone density and structure, unspecified site: Secondary | ICD-10-CM

## 2015-11-12 DIAGNOSIS — Z13820 Encounter for screening for osteoporosis: Secondary | ICD-10-CM

## 2015-11-12 DIAGNOSIS — M8589 Other specified disorders of bone density and structure, multiple sites: Secondary | ICD-10-CM

## 2015-11-12 DIAGNOSIS — M899 Disorder of bone, unspecified: Secondary | ICD-10-CM | POA: Diagnosis not present

## 2015-11-12 NOTE — Telephone Encounter (Signed)
Pt informed with the below note, will call back to schedule  

## 2015-11-12 NOTE — Telephone Encounter (Signed)
Tell patient that her bone density shows some worsening osteopenia. I would like to talk to her about treatment options. Recommend office visit at her convenience.

## 2015-11-13 ENCOUNTER — Other Ambulatory Visit: Payer: Self-pay | Admitting: *Deleted

## 2016-02-06 ENCOUNTER — Encounter: Payer: Self-pay | Admitting: Gynecology

## 2016-02-06 ENCOUNTER — Ambulatory Visit (INDEPENDENT_AMBULATORY_CARE_PROVIDER_SITE_OTHER): Payer: Commercial Managed Care - PPO | Admitting: Gynecology

## 2016-02-06 VITALS — BP 118/76

## 2016-02-06 DIAGNOSIS — M8580 Other specified disorders of bone density and structure, unspecified site: Secondary | ICD-10-CM | POA: Diagnosis not present

## 2016-02-06 LAB — TSH: TSH: 0.78 m[IU]/L

## 2016-02-06 NOTE — Patient Instructions (Signed)
As per our discussion we will plan on repeating your bone density in 2 years.

## 2016-02-06 NOTE — Progress Notes (Signed)
    DRUCIE SCALLON [birth year] [id number]        60 y.o.  [id number] Presents to discuss her most recent bone density which shows a T score of -2.4 at her femoral neck and FRAX of 21%/2.2%. Z scores were normal.  It is stable at all sites from her prior DEXA 2013. She does have a maternal history of hip fracture 2 when her mother was in her 63s.  Past medical history,surgical history, problem list, medications, allergies, family history and social history were all reviewed and documented in the EPIC chart.  Directed ROS with pertinent positives and negatives documented in the history of present illness/assessment and plan.  Exam: Filed Vitals:   02/06/16 1125  BP: 118/76   General appearance:  Normal   Assessment/Plan:  60 y.o. [id number] with osteopenia T score -2.4 stable from prior DEXA 2013 and elevated 10 year fracture risk assessment at 21% for overall fractures. 2.2% for hip fractures. I reviewed the whole issue of osteopenia/osteoporosis, increased risk of fracture and treatment options. She recently had a vitamin D level of 65. No recent TSH. Will go ahead and check this today.  Given her younger age and lower risk of fracture we both agreed to withhold medication treatment at this point but follow expectantly and repeat her DEXA in 2 years. The patient is comfortable with this and will call if she changes her mind.    Anastasio Auerbach MD, 11:51 AM 02/06/2016

## 2016-02-12 ENCOUNTER — Encounter: Payer: Self-pay | Admitting: Gynecology

## 2016-02-13 NOTE — Telephone Encounter (Signed)
If you look at the results it says reference range greater than or equal to 62 years of age is 0.4-4.5 which is where she falls in the normal range. This is the nonpregnant range. Below that it is the pregnancy range if the patient was pregnant

## 2016-06-18 ENCOUNTER — Encounter: Payer: Self-pay | Admitting: Women's Health

## 2016-10-14 ENCOUNTER — Encounter: Payer: Self-pay | Admitting: Women's Health

## 2016-10-14 ENCOUNTER — Ambulatory Visit (INDEPENDENT_AMBULATORY_CARE_PROVIDER_SITE_OTHER): Payer: Commercial Managed Care - PPO | Admitting: Women's Health

## 2016-10-14 VITALS — BP 118/80 | Ht 65.0 in | Wt 157.0 lb

## 2016-10-14 DIAGNOSIS — B009 Herpesviral infection, unspecified: Secondary | ICD-10-CM

## 2016-10-14 DIAGNOSIS — Z01419 Encounter for gynecological examination (general) (routine) without abnormal findings: Secondary | ICD-10-CM

## 2016-10-14 DIAGNOSIS — F419 Anxiety disorder, unspecified: Secondary | ICD-10-CM

## 2016-10-14 DIAGNOSIS — Z13220 Encounter for screening for lipoid disorders: Secondary | ICD-10-CM

## 2016-10-14 LAB — CBC WITH DIFFERENTIAL/PLATELET
Basophils Absolute: 0 {cells}/uL (ref 0–200)
Basophils Relative: 0 %
Eosinophils Absolute: 49 {cells}/uL (ref 15–500)
Eosinophils Relative: 1 %
HCT: 41.7 % (ref 35.0–45.0)
Hemoglobin: 14.3 g/dL (ref 11.7–15.5)
Lymphocytes Relative: 37 %
Lymphs Abs: 1813 {cells}/uL (ref 850–3900)
MCH: 30.1 pg (ref 27.0–33.0)
MCHC: 34.3 g/dL (ref 32.0–36.0)
MCV: 87.8 fL (ref 80.0–100.0)
MPV: 10.4 fL (ref 7.5–12.5)
Monocytes Absolute: 441 {cells}/uL (ref 200–950)
Monocytes Relative: 9 %
Neutro Abs: 2597 {cells}/uL (ref 1500–7800)
Neutrophils Relative %: 53 %
Platelets: 258 K/uL (ref 140–400)
RBC: 4.75 MIL/uL (ref 3.80–5.10)
RDW: 14 % (ref 11.0–15.0)
WBC: 4.9 K/uL (ref 3.8–10.8)

## 2016-10-14 LAB — LIPID PANEL
CHOL/HDL RATIO: 2.9 ratio (ref <5.0)
CHOLESTEROL: 212 mg/dL — AB (ref <200)
HDL: 73 mg/dL (ref >50)
LDL Cholesterol: 123 mg/dL — ABNORMAL HIGH (ref <100)
Triglycerides: 81 mg/dL (ref <150)
VLDL: 16 mg/dL (ref <30)

## 2016-10-14 LAB — COMPREHENSIVE METABOLIC PANEL
ALK PHOS: 57 U/L (ref 33–130)
ALT: 18 U/L (ref 6–29)
AST: 19 U/L (ref 10–35)
Albumin: 4.2 g/dL (ref 3.6–5.1)
BUN: 11 mg/dL (ref 7–25)
CALCIUM: 9.1 mg/dL (ref 8.6–10.4)
CO2: 26 mmol/L (ref 20–31)
Chloride: 104 mmol/L (ref 98–110)
Creat: 0.83 mg/dL (ref 0.50–0.99)
GLUCOSE: 85 mg/dL (ref 65–99)
POTASSIUM: 3.9 mmol/L (ref 3.5–5.3)
Sodium: 139 mmol/L (ref 135–146)
TOTAL PROTEIN: 6.7 g/dL (ref 6.1–8.1)
Total Bilirubin: 0.5 mg/dL (ref 0.2–1.2)

## 2016-10-14 MED ORDER — VALACYCLOVIR HCL 500 MG PO TABS: ORAL_TABLET | ORAL | 6 refills | Status: DC

## 2016-10-14 NOTE — Progress Notes (Signed)
Kimberly Hale 1956-06-09 [id number]    History:    Presents for annual exam.  Postmenopausal/no HRT/no bleeding. Not sexually active husband prostate cancer. Normal Pap and mammogram history. 10/2015 osteopenia T score -2.4 FRAX 21%/2.2% spoke with Dr. from [facility] will increase exercise, calcium rich diet and vitamin D. HSV rare outbreaks. Primary care manages anxiety and depression on BuSpar and occasional Xanax use. Has had Zostavax.  Past medical history, past surgical history, family history and social history were all reviewed and documented in the EPIC chart. Works at [employer]. 1 son engaged.  ROS:  A ROS was performed and pertinent positives and negatives are included.  Exam:  Vitals:   10/14/16 1156  BP: 118/80  Weight: 157 lb (71.2 kg)  Height: 5\' 5"  (1.651 m)   Body mass index is 26.13 kg/m.   General appearance:  Normal Thyroid:  Symmetrical, normal in size, without palpable masses or nodularity. Respiratory  Auscultation:  Clear without wheezing or rhonchi Cardiovascular  Auscultation:  Regular rate, without rubs, murmurs or gallops  Edema/varicosities:  Not grossly evident Abdominal  Soft,nontender, without masses, guarding or rebound.  Liver/spleen:  No organomegaly noted  Hernia:  None appreciated  Skin  Inspection:  Grossly normal   Breasts: Examined lying and sitting.     Right: Without masses, retractions, discharge or axillary adenopathy.     Left: Without masses, retractions, discharge or axillary adenopathy. Gentitourinary   Inguinal/mons:  Normal without inguinal adenopathy  External genitalia:  Normal  BUS/Urethra/Skene's glands:  Normal  Vagina:  Normal Mild atrophy  Cervix:  Normal  Uterus:  normal in size, shape and contour.  Midline and mobile  Adnexa/parametria:     Rt: Without masses or tenderness.   Lt: Without masses or tenderness.  Anus and perineum: Normal  Digital rectal exam: Normal sphincter tone without palpated  masses or tenderness  Assessment/Plan:  60 y.o. M WF G 3  P1 for annual ewith no complaints.  Postmenopausal/no HRT/no bleeding Osteopenia without elevated FRAX Anxiety and depression stable primary care meds HSV rare outbreaks  Plan: Valtrex 500 twice daily for 3-5 days when necessary for prescription, proper use given. SBE's, continue annual screening mammogram, calcium rich diet, vitamin D 1000 daily encouraged. Home safety, fall prevention and importance of weightbearing exercise encouraged, yoga encouraged also. CBC, lipid panel, CMP, UA, Pap normal with negative HR HPV 2016, new screening guidelines reviewed.    [name], 12:46 PM 10/14/2016

## 2016-10-14 NOTE — Patient Instructions (Addendum)
Osteoporosis Osteoporosis is the thinning and loss of density in the bones. Osteoporosis makes the bones more brittle, fragile, and likely to break (fracture). Over time, osteoporosis can cause the bones to become so weak that they fracture after a simple fall. The bones most likely to fracture are the bones in the hip, wrist, and spine. What are the causes? The exact cause is not known. What increases the risk? Anyone can develop osteoporosis. You may be at greater risk if you have a family history of the condition or have poor nutrition. You may also have a higher risk if you are:  Female.  3 years old or older.  A smoker.  Not physically active.  White or Asian.  Slender. What are the signs or symptoms? A fracture might be the first sign of the disease, especially if it results from a fall or injury that would not usually cause a bone to break. Other signs and symptoms include:  Low back and neck pain.  Stooped posture.  Height loss. How is this diagnosed? To make a diagnosis, your health care provider may:  Take a medical history.  Perform a physical exam.  Order tests, such as:  A bone mineral density test.  A dual-energy X-ray absorptiometry test. How is this treated? The goal of osteoporosis treatment is to strengthen your bones to reduce your risk of a fracture. Treatment may involve:  Making lifestyle changes, such as:  Eating a diet rich in calcium.  Doing weight-bearing and muscle-strengthening exercises.  Stopping tobacco use.  Limiting alcohol intake.  Taking medicine to slow the process of bone loss or to increase bone density.  Monitoring your levels of calcium and vitamin D. Follow these instructions at home:  Include calcium and vitamin D in your diet. Calcium is important for bone health, and vitamin D helps the body absorb calcium.  Perform weight-bearing and muscle-strengthening exercises as directed by your health care provider.  Do  not use any tobacco products, including cigarettes, chewing tobacco, and electronic cigarettes. If you need help quitting, ask your health care provider.  Limit your alcohol intake.  Take medicines only as directed by your health care provider.  Keep all follow-up visits as directed by your health care provider. This is important.  Take precautions at home to lower your risk of falling, such as:  Keeping rooms well lit and clutter free.  Installing safety rails on stairs.  Using rubber mats in the bathroom and other areas that are often wet or slippery. Get help right away if: You fall or injure yourself. This information is not intended to replace advice given to you by your health care provider. Make sure you discuss any questions you have with your health care provider. Document Released: 08/19/2005 Document Revised: 04/13/2016 Document Reviewed: 04/19/2014 Elsevier Interactive Patient Education  2017 [place] Maintenance, Female Introduction Adopting a healthy lifestyle and getting preventive care can go a long way to promote health and wellness. Talk with your health care provider about what schedule of regular examinations is right for you. This is a good chance for you to check in with your provider about disease prevention and staying healthy. In between checkups, there are plenty of things you can do on your own. Experts have done a lot of research about which lifestyle changes and preventive measures are most likely to keep you healthy. Ask your health care provider for more information. Weight and diet Eat a healthy diet  Be sure to include plenty  of vegetables, fruits, low-fat dairy products, and lean protein.  Do not eat a lot of foods high in solid fats, added sugars, or salt.  Get regular exercise. This is one of the most important things you can do for your health.  Most adults should exercise for at least 150 minutes each week. The exercise should  increase your heart rate and make you sweat (moderate-intensity exercise).  Most adults should also do strengthening exercises at least twice a week. This is in addition to the moderate-intensity exercise. Maintain a healthy weight  Body mass index (BMI) is a measurement that can be used to identify possible weight problems. It estimates body fat based on height and weight. Your health care provider can help determine your BMI and help you achieve or maintain a healthy weight.  For females 3 years of age and older:  A BMI below 18.5 is considered underweight.  A BMI of 18.5 to 24.9 is normal.  A BMI of 25 to 29.9 is considered overweight.  A BMI of 30 and above is considered obese. Watch levels of cholesterol and blood lipids  You should start having your blood tested for lipids and cholesterol at 62 years of age, then have this test every 5 years.  You may need to have your cholesterol levels checked more often if:  Your lipid or cholesterol levels are high.  You are older than 62 years of age.  You are at high risk for heart disease. Cancer screening Lung Cancer  Lung cancer screening is recommended for adults 31-77 years old who are at high risk for lung cancer because of a history of smoking.  A yearly low-dose CT scan of the lungs is recommended for people who:  Currently smoke.  Have quit within the past 15 years.  Have at least a 30-pack-year history of smoking. A pack year is smoking an average of one pack of cigarettes a day for 1 year.  Yearly screening should continue until it has been 15 years since you quit.  Yearly screening should stop if you develop a health problem that would prevent you from having lung cancer treatment. Breast Cancer  Practice breast self-awareness. This means understanding how your breasts normally appear and feel.  It also means doing regular breast self-exams. Let your health care provider know about any changes, no matter how  small.  If you are in your 20s or 30s, you should have a clinical breast exam (CBE) by a health care provider every 1-3 years as part of a regular health exam.  If you are 85 or older, have a CBE every year. Also consider having a breast X-ray (mammogram) every year.  If you have a family history of breast cancer, talk to your health care provider about genetic screening.  If you are at high risk for breast cancer, talk to your health care provider about having an MRI and a mammogram every year.  Breast cancer gene (BRCA) assessment is recommended for women who have family members with BRCA-related cancers. BRCA-related cancers include:  Breast.  Ovarian.  Tubal.  Peritoneal cancers.  Results of the assessment will determine the need for genetic counseling and BRCA1 and BRCA2 testing. Cervical Cancer  Your health care provider may recommend that you be screened regularly for cancer of the pelvic organs (ovaries, uterus, and vagina). This screening involves a pelvic examination, including checking for microscopic changes to the surface of your cervix (Pap test). You may be encouraged to have this screening  done every 3 years, beginning at age 74.  For women ages 29-65, health care providers may recommend pelvic exams and Pap testing every 3 years, or they may recommend the Pap and pelvic exam, combined with testing for human papilloma virus (HPV), every 5 years. Some types of HPV increase your risk of cervical cancer. Testing for HPV may also be done on women of any age with unclear Pap test results.  Other health care providers may not recommend any screening for nonpregnant women who are considered low risk for pelvic cancer and who do not have symptoms. Ask your health care provider if a screening pelvic exam is right for you.  If you have had past treatment for cervical cancer or a condition that could lead to cancer, you need Pap tests and screening for cancer for at least 20 years  after your treatment. If Pap tests have been discontinued, your risk factors (such as having a new sexual partner) need to be reassessed to determine if screening should resume. Some women have medical problems that increase the chance of getting cervical cancer. In these cases, your health care provider may recommend more frequent screening and Pap tests. Colorectal Cancer  This type of cancer can be detected and often prevented.  Routine colorectal cancer screening usually begins at 62 years of age and continues through 62 years of age.  Your health care provider may recommend screening at an earlier age if you have risk factors for colon cancer.  Your health care provider may also recommend using home test kits to check for hidden blood in the stool.  A small camera at the end of a tube can be used to examine your colon directly (sigmoidoscopy or colonoscopy). This is done to check for the earliest forms of colorectal cancer.  Routine screening usually begins at age 40.  Direct examination of the colon should be repeated every 5-10 years through 62 years of age. However, you may need to be screened more often if early forms of precancerous polyps or small growths are found. Skin Cancer  Check your skin from head to toe regularly.  Tell your health care provider about any new moles or changes in moles, especially if there is a change in a mole's shape or color.  Also tell your health care provider if you have a mole that is larger than the size of a pencil eraser.  Always use sunscreen. Apply sunscreen liberally and repeatedly throughout the day.  Protect yourself by wearing long sleeves, pants, a wide-brimmed hat, and sunglasses whenever you are outside. Heart disease, diabetes, and high blood pressure  High blood pressure causes heart disease and increases the risk of stroke. High blood pressure is more likely to develop in:  People who have blood pressure in the high end of the  normal range (130-139/85-89 mm Hg).  People who are overweight or obese.  People who are African American.  If you are 51-60 years of age, have your blood pressure checked every 3-5 years. If you are 4 years of age or older, have your blood pressure checked every year. You should have your blood pressure measured twice-once when you are at a hospital or clinic, and once when you are not at a hospital or clinic. Record the average of the two measurements. To check your blood pressure when you are not at a hospital or clinic, you can use:  An automated blood pressure machine at a pharmacy.  A home blood pressure monitor.  If you are between 67 years and 37 years old, ask your health care provider if you should take aspirin to prevent strokes.  Have regular diabetes screenings. This involves taking a blood sample to check your fasting blood sugar level.  If you are at a normal weight and have a low risk for diabetes, have this test once every three years after 62 years of age.  If you are overweight and have a high risk for diabetes, consider being tested at a younger age or more often. Preventing infection Hepatitis B  If you have a higher risk for hepatitis B, you should be screened for this virus. You are considered at high risk for hepatitis B if:  You were born in a country where hepatitis B is common. Ask your health care provider which countries are considered high risk.  Your parents were born in a high-risk country, and you have not been immunized against hepatitis B (hepatitis B vaccine).  You have HIV or AIDS.  You use needles to inject street drugs.  You live with someone who has hepatitis B.  You have had sex with someone who has hepatitis B.  You get hemodialysis treatment.  You take certain medicines for conditions, including cancer, organ transplantation, and autoimmune conditions. Hepatitis C  Blood testing is recommended for:  Everyone born from [birth year] through  1967.  Anyone with known risk factors for hepatitis C. Sexually transmitted infections (STIs)  You should be screened for sexually transmitted infections (STIs) including gonorrhea and chlamydia if:  You are sexually active and are younger than 62 years of age.  You are older than 62 years of age and your health care provider tells you that you are at risk for this type of infection.  Your sexual activity has changed since you were last screened and you are at an increased risk for chlamydia or gonorrhea. Ask your health care provider if you are at risk.  If you do not have HIV, but are at risk, it may be recommended that you take a prescription medicine daily to prevent HIV infection. This is called pre-exposure prophylaxis (PrEP). You are considered at risk if:  You are sexually active and do not regularly use condoms or know the HIV status of your partner(s).  You take drugs by injection.  You are sexually active with a partner who has HIV. Talk with your health care provider about whether you are at high risk of being infected with HIV. If you choose to begin PrEP, you should first be tested for HIV. You should then be tested every 3 months for as long as you are taking PrEP. Pregnancy  If you are premenopausal and you may become pregnant, ask your health care provider about preconception counseling.  If you may become pregnant, take 400 to 800 micrograms (mcg) of folic acid every day.  If you want to prevent pregnancy, talk to your health care provider about birth control (contraception). Osteoporosis and menopause  Osteoporosis is a disease in which the bones lose minerals and strength with aging. This can result in serious bone fractures. Your risk for osteoporosis can be identified using a bone density scan.  If you are 7 years of age or older, or if you are at risk for osteoporosis and fractures, ask your health care provider if you should be screened.  Ask your health  care provider whether you should take a calcium or vitamin D supplement to lower your risk for osteoporosis.  Menopause may  have certain physical symptoms and risks.  Hormone replacement therapy may reduce some of these symptoms and risks. Talk to your health care provider about whether hormone replacement therapy is right for you. Follow these instructions at home:  Schedule regular health, dental, and eye exams.  Stay current with your immunizations.  Do not use any tobacco products including cigarettes, chewing tobacco, or electronic cigarettes.  If you are pregnant, do not drink alcohol.  If you are breastfeeding, limit how much and how often you drink alcohol.  Limit alcohol intake to no more than 1 drink per day for nonpregnant women. One drink equals 12 ounces of beer, 5 ounces of wine, or 1 ounces of hard liquor.  Do not use street drugs.  Do not share needles.  Ask your health care provider for help if you need support or information about quitting drugs.  Tell your health care provider if you often feel depressed.  Tell your health care provider if you have ever been abused or do not feel safe at home. This information is not intended to replace advice given to you by your health care provider. Make sure you discuss any questions you have with your health care provider. Document Released: 05/25/2011 Document Revised: 04/16/2016 Document Reviewed: 08/13/2015  2017 Elsevier

## 2016-10-15 LAB — URINALYSIS W MICROSCOPIC + REFLEX CULTURE
BILIRUBIN URINE: NEGATIVE
Bacteria, UA: NONE SEEN [HPF]
CRYSTALS: NONE SEEN [HPF]
Casts: NONE SEEN [LPF]
GLUCOSE, UA: NEGATIVE
Hgb urine dipstick: NEGATIVE
KETONES UR: NEGATIVE
LEUKOCYTES UA: NEGATIVE
Nitrite: NEGATIVE
PROTEIN: NEGATIVE
RBC / HPF: NONE SEEN RBC/HPF (ref <=2)
Specific Gravity, Urine: 1.005 (ref 1.001–1.035)
Squamous Epithelial / LPF: NONE SEEN [HPF] (ref <=5)
WBC UA: NONE SEEN WBC/HPF (ref <=5)
Yeast: NONE SEEN [HPF]
pH: 6 (ref 5.0–8.0)

## 2016-10-19 ENCOUNTER — Encounter: Payer: Self-pay | Admitting: Women's Health

## 2016-10-24 ENCOUNTER — Emergency Department (HOSPITAL_BASED_OUTPATIENT_CLINIC_OR_DEPARTMENT_OTHER)
Admission: EM | Admit: 2016-10-24 | Discharge: 2016-10-24 | Disposition: A | Discharge status: Home / Self Care | Payer: Commercial Managed Care - PPO | Attending: Emergency Medicine | Admitting: Emergency Medicine

## 2016-10-24 ENCOUNTER — Emergency Department (HOSPITAL_BASED_OUTPATIENT_CLINIC_OR_DEPARTMENT_OTHER): Payer: Commercial Managed Care - PPO

## 2016-10-24 ENCOUNTER — Encounter (HOSPITAL_BASED_OUTPATIENT_CLINIC_OR_DEPARTMENT_OTHER): Payer: Self-pay | Admitting: *Deleted

## 2016-10-24 DIAGNOSIS — R110 Nausea: Secondary | ICD-10-CM | POA: Insufficient documentation

## 2016-10-24 DIAGNOSIS — R109 Unspecified abdominal pain: Secondary | ICD-10-CM | POA: Diagnosis present

## 2016-10-24 DIAGNOSIS — R197 Diarrhea, unspecified: Secondary | ICD-10-CM | POA: Diagnosis not present

## 2016-10-24 LAB — CBC
HEMATOCRIT: 40.4 % (ref 36.0–46.0)
HEMOGLOBIN: 14.3 g/dL (ref 12.0–15.0)
MCH: 30.8 pg (ref 26.0–34.0)
MCHC: 35.4 g/dL (ref 30.0–36.0)
MCV: 86.9 fL (ref 78.0–100.0)
Platelets: 234 10*3/uL (ref 150–400)
RBC: 4.65 MIL/uL (ref 3.87–5.11)
RDW: 12.4 % (ref 11.5–15.5)
WBC: 7.2 10*3/uL (ref 4.0–10.5)

## 2016-10-24 LAB — HEPATIC FUNCTION PANEL
ALBUMIN: 4.5 g/dL (ref 3.5–5.0)
ALK PHOS: 50 U/L (ref 38–126)
ALT: 26 U/L (ref 14–54)
AST: 24 U/L (ref 15–41)
BILIRUBIN TOTAL: 0.6 mg/dL (ref 0.3–1.2)
Bilirubin, Direct: 0.1 mg/dL (ref 0.1–0.5)
Indirect Bilirubin: 0.5 mg/dL (ref 0.3–0.9)
Total Protein: 7.1 g/dL (ref 6.5–8.1)

## 2016-10-24 LAB — BASIC METABOLIC PANEL
ANION GAP: 11 (ref 5–15)
BUN: 13 mg/dL (ref 6–20)
CO2: 23 mmol/L (ref 22–32)
Calcium: 9.2 mg/dL (ref 8.9–10.3)
Chloride: 99 mmol/L — ABNORMAL LOW (ref 101–111)
Creatinine, Ser: 0.85 mg/dL (ref 0.44–1.00)
GLUCOSE: 102 mg/dL — AB (ref 65–99)
POTASSIUM: 3.4 mmol/L — AB (ref 3.5–5.1)
Sodium: 133 mmol/L — ABNORMAL LOW (ref 135–145)

## 2016-10-24 LAB — URINALYSIS, ROUTINE W REFLEX MICROSCOPIC
Bilirubin Urine: NEGATIVE
GLUCOSE, UA: NEGATIVE mg/dL
Hgb urine dipstick: NEGATIVE
Ketones, ur: 40 mg/dL — AB
LEUKOCYTES UA: NEGATIVE
NITRITE: NEGATIVE
PH: 6.5 (ref 5.0–8.0)
Protein, ur: NEGATIVE mg/dL
Specific Gravity, Urine: 1.006 (ref 1.005–1.030)

## 2016-10-24 MED ORDER — FENTANYL CITRATE (PF) 100 MCG/2ML IJ SOLN
50.0000 ug | INTRAMUSCULAR | Status: DC | PRN
  Administered 2016-10-24: 50 ug via INTRAVENOUS
  Filled 2016-10-24: qty 2

## 2016-10-24 MED ORDER — KETOROLAC TROMETHAMINE 15 MG/ML IJ SOLN
15.0000 mg | Freq: Once | INTRAMUSCULAR | Status: AC
  Administered 2016-10-24: 15 mg via INTRAVENOUS
  Filled 2016-10-24: qty 1

## 2016-10-24 MED ORDER — HYDROCODONE-ACETAMINOPHEN 5-325 MG PO TABS: 1.0000 | ORAL_TABLET | Freq: Three times a day (TID) | ORAL | 0 refills | Status: DC | PRN

## 2016-10-24 MED ORDER — HYDROCODONE-ACETAMINOPHEN 5-325 MG PO TABS
1.0000 | ORAL_TABLET | Freq: Once | ORAL | Status: AC
  Administered 2016-10-24: 1 via ORAL
  Filled 2016-10-24: qty 1

## 2016-10-24 MED ORDER — IBUPROFEN 600 MG PO TABS: 600.0000 mg | ORAL_TABLET | Freq: Three times a day (TID) | ORAL | 0 refills | Status: DC | PRN

## 2016-10-24 NOTE — ED Triage Notes (Signed)
Right flank pain, nonradiating since waking up this morning.  Reports nausea, denies vomiting.  No change in pain with change in position.  Denies hx of kidney stones.  Went to PCP this morning-urinalysis negative for blood-pt has reports with her.

## 2016-10-24 NOTE — Discharge Instructions (Signed)
CLINICAL DATA:  Right flank pain and nausea since this morning.   EXAM: CT ABDOMEN AND PELVIS WITHOUT CONTRAST   TECHNIQUE: Multidetector CT imaging of the abdomen and pelvis was performed following the standard protocol without IV contrast.   COMPARISON:  None.   FINDINGS: Lower chest: Mild linear atelectasis or scarring at the left lung base.   Hepatobiliary: Tiny gallstone in the gallbladder, measuring 2 mm in maximum diameter. There is an additional smaller cholesterol stone in the gallbladder. No gallbladder wall thickening or pericholecystic fluid. Unremarkable liver.   Pancreas: Unremarkable. No pancreatic ductal dilatation or surrounding inflammatory changes.   Spleen: Normal in size without focal abnormality.   Adrenals/Urinary Tract: Adrenal glands are unremarkable. Kidneys are normal, without renal calculi, focal lesion, or hydronephrosis. Bladder is unremarkable.   Stomach/Bowel: Stomach is within normal limits. Appendix appears normal. No evidence of bowel wall thickening, distention, or inflammatory changes.   Vascular/Lymphatic: Minimal aortic calcification. No enlarged lymph nodes.   Reproductive: Uterus and bilateral adnexa are unremarkable.   Other: Tiny umbilical hernia containing fat.   Musculoskeletal: Mild lumbar lower thoracic spine degenerative changes. Small left femoral head and left sacral bone islands.   IMPRESSION: 1. No acute abnormality. 2. Cholelithiasis without evidence of cholecystitis. 3. Minimal aortic atherosclerosis.

## 2016-10-24 NOTE — ED Provider Notes (Signed)
[facility] DEPT [facility] Provider Note   CSN: [id number] Arrival date & time: 10/24/16  1633   By signing my name below, I, Neta Mends, attest that this documentation has been prepared under the direction and in the presence of Quintella Reichert, MD . Electronically Signed: Neta Mends, ED Scribe. 10/24/2016. 5:23 PM.   History   Chief Complaint Chief Complaint  Patient presents with  . Flank Pain    The history is provided by the patient. No language interpreter was used.   HPI Comments:  Kimberly Hale is a 60 y.o. female who presents to the Emergency Department complaining of constant right side flank pain that began this AM. Pt states that the pain is similar to labor pain. Pt states that she cannot find a comfortable position. Pt complains of associated diarrhea, nausea. Pt went to UC today and had a urinalysis with no significant results. Pt used ice, heat and advil with no relief.  Pt denies vomiting, black stools, numbness, weakness.   Past Medical History:  Diagnosis Date  . Anxiety   . Environmental allergies   . History of ETOH abuse    NONE IN 10 YEARS  . Osteopenia 10/2015   T score -2.4 right femoral neck FRAX 21%/2.2%  . Rosacea     Patient Active Problem List   Diagnosis Date Noted  . Osteopenia 09/09/2012  . Rosacea 09/09/2012  . HSV infection 09/09/2012    Past Surgical History:  Procedure Laterality Date  . BUNIONECTOMY    . CESAREAN SECTION  1988  . CYSTOSCOPY  1975  . LIPOMA EXCISION  1987  . THERAPEUTIC ABORTION     X2    OB History    Gravida Para Term Preterm AB Living   3 1     2 1    SAB TAB Ectopic Multiple Live Births     2             Home Medications    Prior to Admission medications   Medication Sig Start Date End Date Taking? Authorizing Provider  ALPRAZolam Duanne Moron) 0.25 MG tablet Take 1 tablet (0.25 mg total) by mouth at bedtime as needed for sleep. 10/04/15   Huel Cote, NP  busPIRone (BUSPAR) 10 MG  tablet 1/2 tab bid    Historical Provider, MD  cholecalciferol (VITAMIN D) 1000 UNITS tablet Take 1,000 Units by mouth daily.      Historical Provider, MD  famotidine (PEPCID) 20 MG tablet Take 20 mg by mouth at bedtime.     Historical Provider, MD  fluticasone (FLONASE) 50 MCG/ACT nasal spray Place 2 sprays into the nose daily.      Historical Provider, MD  HYDROcodone-acetaminophen (NORCO) 5-325 MG tablet Take 1 tablet by mouth every 8 (eight) hours as needed for moderate pain. 10/24/16   Quintella Reichert, MD  ibuprofen (ADVIL,MOTRIN) 600 MG tablet Take 1 tablet (600 mg total) by mouth every 8 (eight) hours as needed. 10/24/16   Quintella Reichert, MD  Multiple Vitamin (MULTIVITAMIN) tablet Take 1 tablet by mouth daily.      Historical Provider, MD  NON FORMULARY Gel for rocasea As directed    Historical Provider, MD  omega-3 acid ethyl esters (LOVAZA) 1 G capsule Take by mouth 2 (two) times daily.    Historical Provider, MD  valACYclovir (VALTREX) 500 MG tablet Take 1 tablet twice daily for 3-5 days 10/14/16   Huel Cote, NP    Family History Family History  Problem Relation  Age of Onset  . Alcohol abuse Mother   . Heart disease Father   . Alcohol abuse Father   . Diabetes Maternal Grandmother   . Hypertension Maternal Grandmother     Social History Social History  Substance Use Topics  . Smoking status: Never Smoker  . Smokeless tobacco: Never Used  . Alcohol use No     Allergies   Other and Sulfa antibiotics   Review of Systems Review of Systems  Gastrointestinal: Positive for diarrhea and nausea. Negative for blood in stool and vomiting.  Genitourinary: Positive for flank pain.  Neurological: Negative for weakness and numbness.  All other systems reviewed and are negative.    Physical Exam Updated Vital Signs BP 143/84 (BP Location: Left Arm)   Pulse 65   Temp 97.5 F (36.4 C) (Oral)   Resp 16   Ht 5' 4.5" (1.638 m)   Wt 155 lb (70.3 kg)   LMP 10/08/2007   SpO2  100%   BMI 26.19 kg/m   Physical Exam  Constitutional: She is oriented to person, place, and time. She appears well-developed and well-nourished.  Uncomfortable appearing.   HENT:  Head: Normocephalic and atraumatic.  Cardiovascular: Normal rate and regular rhythm.   No murmur heard. Pulmonary/Chest: Effort normal and breath sounds normal. No respiratory distress.  Abdominal: Soft. There is no tenderness. There is no rebound and no guarding.  No flank tenderness.  Musculoskeletal: She exhibits no edema or tenderness.  Neurological: She is alert and oriented to person, place, and time.  Skin: Skin is warm and dry.  Psychiatric: She has a normal mood and affect. Her behavior is normal.  Nursing note and vitals reviewed.    ED Treatments / Results  DIAGNOSTIC STUDIES:  Oxygen Saturation is 98% on RA, normal by my interpretation.    COORDINATION OF CARE:  5:23 PM Discussed treatment plan with pt at bedside and pt agreed to plan.   Labs (all labs ordered are listed, but only abnormal results are displayed) Labs Reviewed  URINALYSIS, ROUTINE W REFLEX MICROSCOPIC (NOT AT [facility]) - Abnormal; Notable for the following:       Result Value   Ketones, ur 40 (*)    All other components within normal limits  BASIC METABOLIC PANEL - Abnormal; Notable for the following:    Sodium 133 (*)    Potassium 3.4 (*)    Chloride 99 (*)    Glucose, Bld 102 (*)    All other components within normal limits  CBC  HEPATIC FUNCTION PANEL    EKG  EKG Interpretation None       Radiology Ct Renal Stone Study  Result Date: 10/24/2016 CLINICAL DATA:  Right flank pain and nausea since this morning. EXAM: CT ABDOMEN AND PELVIS WITHOUT CONTRAST TECHNIQUE: Multidetector CT imaging of the abdomen and pelvis was performed following the standard protocol without IV contrast. COMPARISON:  None. FINDINGS: Lower chest: Mild linear atelectasis or scarring at the left lung base. Hepatobiliary: Tiny gallstone  in the gallbladder, measuring 2 mm in maximum diameter. There is an additional smaller cholesterol stone in the gallbladder. No gallbladder wall thickening or pericholecystic fluid. Unremarkable liver. Pancreas: Unremarkable. No pancreatic ductal dilatation or surrounding inflammatory changes. Spleen: Normal in size without focal abnormality. Adrenals/Urinary Tract: Adrenal glands are unremarkable. Kidneys are normal, without renal calculi, focal lesion, or hydronephrosis. Bladder is unremarkable. Stomach/Bowel: Stomach is within normal limits. Appendix appears normal. No evidence of bowel wall thickening, distention, or inflammatory changes. Vascular/Lymphatic: Minimal aortic  calcification. No enlarged lymph nodes. Reproductive: Uterus and bilateral adnexa are unremarkable. Other: Tiny umbilical hernia containing fat. Musculoskeletal: Mild lumbar lower thoracic spine degenerative changes. Small left femoral head and left sacral bone islands. IMPRESSION: 1. No acute abnormality. 2. Cholelithiasis without evidence of cholecystitis. 3. Minimal aortic atherosclerosis. Electronically Signed   By: Claudie Revering M.D.   On: 10/24/2016 17:56    Procedures Procedures (including critical care time)  Medications Ordered in ED Medications  ketorolac (TORADOL) 15 MG/ML injection 15 mg (15 mg Intravenous Given 10/24/16 1735)  HYDROcodone-acetaminophen (NORCO/VICODIN) 5-325 MG per tablet 1 tablet (1 tablet Oral Given 10/24/16 1910)     Initial Impression / Assessment and Plan / ED Course  I have reviewed the triage vital signs and the nursing notes.  Pertinent labs & imaging results that were available during my care of the patient were reviewed by me and considered in my medical decision making (see chart for details).  Clinical Course     Patient here for evaluation of one day of right flank pain. She is nontoxic appearing on examination. Labs demonstrate mild hyponatremia. UA with no evidence of UTI. CT scan  is negative for stone. Presentation is not consistent with dissection, cholecystitis, epidural abscess. Discussed with patient on care for flank pain of unclear etiology. Discussed PCP follow-up as well as return precautions.  Final Clinical Impressions(s) / ED Diagnoses   Final diagnoses:  Right flank pain    New Prescriptions Discharge Medication List as of 10/24/2016  8:10 PM    START taking these medications   Details  HYDROcodone-acetaminophen (NORCO) 5-325 MG tablet Take 1 tablet by mouth every 8 (eight) hours as needed for moderate pain., Starting Sat 10/24/2016, Print    ibuprofen (ADVIL,MOTRIN) 600 MG tablet Take 1 tablet (600 mg total) by mouth every 8 (eight) hours as needed., Starting Sat 10/24/2016, Print      I personally performed the services described in this documentation, which was scribed in my presence. The recorded information has been reviewed and is accurate.     Quintella Reichert, MD 10/25/16 [phone]

## 2016-10-24 NOTE — ED Notes (Signed)
Pt alert, NAD, calm, ,interactive, resps e/u, speaking in clear complete sentences, VSS, "feel better", (denies: sob or dizziness), reports pain, mild nausea and hunger.

## 2016-10-24 NOTE — ED Notes (Signed)
Vital signs stable. 

## 2016-10-24 NOTE — ED Notes (Signed)
Patient transported to CT 

## 2017-02-12 DIAGNOSIS — M7672 Peroneal tendinitis, left leg: Secondary | ICD-10-CM | POA: Diagnosis not present

## 2017-02-12 DIAGNOSIS — M79672 Pain in left foot: Secondary | ICD-10-CM | POA: Diagnosis not present

## 2017-02-22 ENCOUNTER — Ambulatory Visit: Payer: Commercial Managed Care - PPO | Attending: Orthopedic Surgery | Admitting: Physical Therapy

## 2017-02-22 DIAGNOSIS — M79672 Pain in left foot: Secondary | ICD-10-CM | POA: Diagnosis present

## 2017-02-22 DIAGNOSIS — M25552 Pain in left hip: Secondary | ICD-10-CM | POA: Diagnosis present

## 2017-02-22 DIAGNOSIS — M6281 Muscle weakness (generalized): Secondary | ICD-10-CM | POA: Diagnosis present

## 2017-02-22 DIAGNOSIS — L814 Other melanin hyperpigmentation: Secondary | ICD-10-CM | POA: Diagnosis not present

## 2017-02-22 DIAGNOSIS — L821 Other seborrheic keratosis: Secondary | ICD-10-CM | POA: Diagnosis not present

## 2017-02-22 DIAGNOSIS — D225 Melanocytic nevi of trunk: Secondary | ICD-10-CM | POA: Diagnosis not present

## 2017-02-22 NOTE — Patient Instructions (Signed)
Achilles / Gastroc, Standing    Stand, right foot behind, heel on floor and turned slightly out, leg straight, forward leg bent. Move hips forward. Hold _30__ seconds. Repeat __3_ times per session. Do __2-3_ sessions per day.  Copyright  VHI. All rights reserved.  Try dropping your heel off a step and holding 30 sec.  As an alternative  Weight Shifting: Single Leg: Varied Surfaces    Standing on left eg, work on Orthoptist.  slowly shift weight backward until toes begin to rise off floor. Return to starting position. Shift weight forward until heel begins to rise off floor. Work up to 30 sec at a time.  Do __2__ sessions per day. Repeat with eyes closed. Repeat on compliant surface: __RUG or standing on a pillow. ______.  Copyright  VHI. All rights reserved.

## 2017-02-22 NOTE — Therapy (Signed)
[place], [place], [address] Phone: [phone]   Fax:  [phone]  Physical Therapy Evaluation  Patient Details  Name: Kimberly Hale MRN: [id number] Date of Birth: 30-Sep-1956 Referring Provider: Mechele Claude PA and Dr. Wylene Simmer   Encounter Date: 02/22/2017      PT End of Session - 02/22/17 1412    Visit Number 1   Number of Visits 8   Date for PT Re-Evaluation 02/27/17   PT Start Time 1332   PT Stop Time 1411   PT Time Calculation (min) 39 min   Activity Tolerance Patient tolerated treatment well   Behavior During Therapy [facility] for tasks assessed/performed      Past Medical History:  Diagnosis Date  . Anxiety   . Environmental allergies   . History of ETOH abuse    NONE IN 10 YEARS  . Osteopenia 10/2015   T score -2.4 right femoral neck FRAX 21%/2.2%  . Rosacea     Past Surgical History:  Procedure Laterality Date  . BUNIONECTOMY    . CESAREAN SECTION  1988  . CYSTOSCOPY  1975  . LIPOMA EXCISION  1987  . THERAPEUTIC ABORTION     X2    There were no vitals filed for this visit.       Subjective Assessment - 02/22/17 1335    Subjective Pt has had L foot pain since last summer.  Pain subsided.  Noticed a raised area in lateral foot. Really began to bother her in Dec.  as she wore closed shoes.  Meloxicam did not help her.     Pertinent History L hip tear, to have surgery. Wears orthotics (superfeet) chronic plantar fasciitis.     Limitations Walking;Other (comment);House hold activities  sudden turns, sleeping (pain with turning over) , has limited walking with her dog    How long can you walk comfortably? limited by L hip pain mostly    Diagnostic tests XR   Currently in Pain? Yes   Pain Score 2    Pain Location Foot   Pain Orientation Left   Pain Descriptors / Indicators Sharp   Pain Type Chronic pain   Pain Radiating Towards no but to lower foot briefly    Pain Onset  More than a month ago   Pain Frequency Intermittent   Aggravating Factors  walking and making sudden turns    Pain Relieving Factors icing 20 min each night,    Effect of Pain on Daily Activities does not limit her activity but is cautious             [facility] PT Assessment - 02/22/17 1347      Assessment   Medical Diagnosis L peroneal tendinosis, L foot pain    Referring Provider Mechele Claude PA and Dr. Wylene Simmer    Onset Date/Surgical Date --  chronic 1 yr    Prior Therapy Not for foot but hip      Precautions   Precautions None     Restrictions   Weight Bearing Restrictions No     Balance Screen   Has the patient fallen in the past 6 months No  her balance is OK, is more cautious, Hip pain    Has the patient had a decrease in activity level because of a fear of falling?  Yes   Is the patient reluctant to leave their home because of a fear of falling?  No     Home Environment  Living Environment Private residence     Prior Function   Level of Independence Independent   Vocation Full time employment   Vocation Requirements sedentary but can chage positions as needed      Cognition   Overall Cognitive Status Within Functional Limits for tasks assessed     Observation/Other Assessments   Focus on Therapeutic Outcomes (FOTO)  29%     Observation/Other Assessments-Edema    Edema --  [facility]      Sensation   Light Touch Appears Intact     Posture/Postural Control   Posture/Postural Control No significant limitations   Posture Comments Rt. toes more splayed in standing due to surgery, high arches     AROM   Right Ankle Inversion 15   Right Ankle Eversion 35   Left Ankle Inversion 15   Left Ankle Eversion 30     PROM   Overall PROM Comments end range pain EV and INV     Strength   Left Ankle Dorsiflexion 5/5   Left Ankle Plantar Flexion 4/5   Left Ankle Inversion 4+/5  pain    Left Ankle Eversion 5/5     Flexibility   Soft Tissue Assessment /Muscle  Length --  tight gastroc L>R      Palpation   Palpation comment tender on base of 5th metatarsal on both dorsal and ventral foot   not sore in peroneals or other areas      Ambulation/Gait   Ambulation Distance (Feet) 150 Feet   Assistive device None   Gait Pattern Step-through pattern   Gait Comments no deviations noted      Static Standing Balance   Static Standing - Balance Support No upper extremity supported   Static Standing - Level of Assistance 7: Independent   Static Standing Balance -  Activities  Single Leg Stance - Right Leg;Single Leg Stance - Left Leg   Static Standing - Comment/# of Minutes less than 10 sec each foot               [facility] Adult PT Treatment/Exercise - 02/22/17 1347      Iontophoresis   Type of Iontophoresis Dexamethasone   Location L ankle 5th met   Dose 1 cc    Time 6 hr patch      Ankle Exercises: Standing   SLS gave for HEP    Heel Raises 20 reps     Ankle Exercises: Stretches   Plantar Fascia Stretch 2 reps;30 seconds   Plantar Fascia Stretch Limitations HEP            PT Education - 02/22/17 1517    Education provided Yes   Education Details PT/POC, HEP, ionto, foot intrinsics   Person(s) Educated Patient   Methods Explanation;Demonstration;Handout;Tactile cues;Verbal cues   Comprehension Verbalized understanding;Returned demonstration             PT Long Term Goals - 02/22/17 1526      PT LONG TERM GOAL #1   Title Patient will be independent in safe, self progression of HEP for L ankle and balance.    Time 4   Period Weeks   Status New     PT LONG TERM GOAL #2   Title Pt will be able to stand on LLE (and Rt) for min dynamic single leg balance activties without increasing pain in foot.    Time 4   Period Weeks   Status New     PT LONG TERM GOAL #3  Title Patient will report taking frequent stretch breaks at work for general fitness and mobility   Time 4   Period Weeks   Status New     PT LONG TERM GOAL  #4   Title Pt will demo full strength in L ankle without foot pain for maximal gait efficiency.    Time 4   Period Weeks   Status New               Plan - 02/22/17 1517    Clinical Impression Statement Pt with low complexity eval of L foot pain which has become chronic over the past year.  Her pain does not limit her activities.  She has a sedentary job and has been less active due to chronic L hip pain. She has likely compensated for hip pain with a faulty foot mechanics. She has decent strength and AROM but did have pain increase with plantarflexion and with single leg balance activities.  She will do well with measures for anti-inflammation and corrective foot and LE exercises.     Rehab Potential Excellent   PT Frequency 2x / week   PT Duration 4 weeks   PT Treatment/Interventions Iontophoresis 40m/ml Dexamethasone;Functional mobility training;Orthotic Fit/Training;Passive range of motion;Gait training;Neuromuscular re-education;Balance training;Manual techniques;Taping;Therapeutic activities;Moist Heat;Therapeutic exercise;Ultrasound;Cryotherapy;ADLs/Self Care Home Management   PT Next Visit Plan check HEP, consider bands for eversion of L foot.  Standing, CKC for balance and stability.    PT Home Exercise Plan calf stretch, SLS   Consulted and Agree with Plan of Care Patient      Patient will benefit from skilled therapeutic intervention in order to improve the following deficits and impairments:  Decreased balance, Decreased strength, Improper body mechanics, Pain, Decreased mobility  Visit Diagnosis: Pain in left foot     Problem List Patient Active Problem List   Diagnosis Date Noted  . Osteopenia 09/09/2012  . Rosacea 09/09/2012  . HSV infection 09/09/2012    PAA,JENNIFER 02/22/2017, 3:33 PM  [facility] [address]Phone: [phone]  Fax:  [phone] Name: ECAMARI QUINTANILLAMRN: [id number] of Birth: 204-03-1956  JRaeford Razor PT 02/22/17 3:33 PM Phone: [phone]: [phone]

## 2017-02-24 ENCOUNTER — Ambulatory Visit: Payer: Commercial Managed Care - PPO | Admitting: Physical Therapy

## 2017-02-24 DIAGNOSIS — M79672 Pain in left foot: Secondary | ICD-10-CM

## 2017-02-24 DIAGNOSIS — M25552 Pain in left hip: Secondary | ICD-10-CM | POA: Diagnosis not present

## 2017-02-24 DIAGNOSIS — M6281 Muscle weakness (generalized): Secondary | ICD-10-CM | POA: Diagnosis not present

## 2017-02-24 NOTE — Patient Instructions (Signed)

## 2017-02-24 NOTE — Therapy (Signed)
[place], [place], [address] Phone: [phone]   Fax:  [phone]  Physical Therapy Treatment  Patient Details  Name: Kimberly Hale MRN: [id number] Date of Birth: [birth year] Referring Provider: Mechele Claude PA and Dr. Wylene Simmer   Encounter Date: 02/24/2017      PT End of Session - 02/24/17 1150    Visit Number 2   Number of Visits 8   Date for PT Re-Evaluation 03/29/17   PT Start Time 1100   PT Stop Time 1200   PT Time Calculation (min) 60 min   Activity Tolerance Patient tolerated treatment well   Behavior During Therapy [facility] for tasks assessed/performed      Past Medical History:  Diagnosis Date  . Anxiety   . Environmental allergies   . History of ETOH abuse    NONE IN 10 YEARS  . Osteopenia 10/2015   T score -2.4 right femoral neck FRAX 21%/2.2%  . Rosacea     Past Surgical History:  Procedure Laterality Date  . BUNIONECTOMY    . CESAREAN SECTION  1988  . CYSTOSCOPY  1975  . LIPOMA EXCISION  1987  . THERAPEUTIC ABORTION     X2    There were no vitals filed for this visit.      Subjective Assessment - 02/24/17 1106    Subjective No change with the patch.  Has tried the exercises.  Pain is mild only when I turn it wrong (several times a day) I've always had weak ankles.     Currently in Pain? Yes   Pain Score 2             [facility] Adult PT Treatment/Exercise - 02/24/17 1114      Iontophoresis   Type of Iontophoresis Dexamethasone   Location L ankle 5th met   Dose 2.5 cc    Time 15 min      Manual Therapy   Manual Therapy Joint mobilization;Taping   Joint Mobilization Metatarsals and distal fibula    McConnell to distal fibula in ankle DF post ionto   used McConnell tape      Ankle Exercises: Stretches   Soleus Stretch 2 reps;30 seconds   Slant Board Stretch 2 reps;30 seconds     Ankle Exercises: Standing   SLS static and min dynamic on floor and with  foam , needed definite UE support    Heel Raises 10 reps  3 sets , varied positions    Toe Raise 10 reps     Ankle Exercises: Supine   T-Band green band all planes x 10 slow for eccentric focus                 PT Education - 02/24/17 1149    Education provided Yes   Education Details ionto , tape    Person(s) Educated Patient   Methods Explanation   Comprehension Verbalized understanding             PT Long Term Goals - 02/22/17 1526      PT LONG TERM GOAL #1   Title Patient will be independent in safe, self progression of HEP for L ankle and balance.    Time 4   Period Weeks   Status New     PT LONG TERM GOAL #2   Title Pt will be able to stand on LLE (and Rt) for min dynamic single leg balance activties without increasing pain in foot.  Time 4   Period Weeks   Status New     PT LONG TERM GOAL #3   Title Patient will report taking frequent stretch breaks at work for general fitness and mobility   Time 4   Period Weeks   Status New     PT LONG TERM GOAL #4   Title Pt will demo full strength in L ankle without foot pain for maximal gait efficiency.    Time 4   Period Weeks   Status New               Plan - 02/24/17 1106    Clinical Impression Statement Pt's 2nd visit. No adverse effects from ionto.  Trial of tape to see if that stabilizes her foot/ankle and reduces pain.    PT Next Visit Plan  consider bands for eversion of L foot.  Standing, CKC for balance and stability. How was tape and ionto?    PT Home Exercise Plan calf stretch, SLS   Consulted and Agree with Plan of Care Patient      Patient will benefit from skilled therapeutic intervention in order to improve the following deficits and impairments:  Decreased balance, Decreased strength, Improper body mechanics, Pain, Decreased mobility  Visit Diagnosis: Pain in left foot     Problem List Patient Active Problem List   Diagnosis Date Noted  . Osteopenia 09/09/2012  .  Rosacea 09/09/2012  . HSV infection 09/09/2012    Chayla Shands 02/24/2017, 11:54 AM  [facility] [address] [place], [place], [address] Phone: [phone]   Fax:  [phone]  Name: Kimberly Hale MRN: [id number] Date of Birth: 06/21/1956  Raeford Razor, PT 02/24/17 11:54 AM Phone: [phone] Fax: [phone]

## 2017-03-02 ENCOUNTER — Ambulatory Visit: Payer: Commercial Managed Care - PPO | Admitting: Physical Therapy

## 2017-03-02 DIAGNOSIS — M79672 Pain in left foot: Secondary | ICD-10-CM

## 2017-03-02 DIAGNOSIS — M6281 Muscle weakness (generalized): Secondary | ICD-10-CM | POA: Diagnosis not present

## 2017-03-02 DIAGNOSIS — M25552 Pain in left hip: Secondary | ICD-10-CM | POA: Diagnosis not present

## 2017-03-02 NOTE — Therapy (Signed)
[place], [place], [address] Phone: [phone]   Fax:  [phone]  Physical Therapy Treatment  Patient Details  Name: Kimberly Hale MRN: [id number] Date of Birth: 1956-03-30 Referring Provider: Mechele Claude PA and Dr. Wylene Simmer   Encounter Date: 03/02/2017      PT End of Session - 03/02/17 1350    Visit Number 3   Number of Visits 8   Date for PT Re-Evaluation 03/29/17   PT Start Time 1333   PT Stop Time 1400  had to leave early    PT Time Calculation (min) 27 min   Activity Tolerance Patient tolerated treatment well   Behavior During Therapy [facility] for tasks assessed/performed      Past Medical History:  Diagnosis Date  . Anxiety   . Environmental allergies   . History of ETOH abuse    NONE IN 10 YEARS  . Osteopenia 10/2015   T score -2.4 right femoral neck FRAX 21%/2.2%  . Rosacea     Past Surgical History:  Procedure Laterality Date  . BUNIONECTOMY    . CESAREAN SECTION  1988  . CYSTOSCOPY  1975  . LIPOMA EXCISION  1987  . THERAPEUTIC ABORTION     X2    There were no vitals filed for this visit.      Subjective Assessment - 03/02/17 1332    Subjective Pt without pain today, had to leave right at 2:00.  Didn't really have mich time to focus on the pain this weekend.  I was doing alot and didnt have any awareness of pain. Ices everynight.    Currently in Pain? No/denies             [facility] Adult PT Treatment/Exercise - 03/02/17 1342      Iontophoresis   Type of Iontophoresis Dexamethasone   Location L ankle 5th met   Dose 2.5 cc    Time 15 min , 40 mA dose (3.0)      Manual Therapy   McConnell to distal fibula in ankle DF post ionto   used McConnell tape      Ankle Exercises: Supine   T-Band green band EV and INV demo and cues for HEP                 PT Education - 03/02/17 1351    Education provided Yes   Education Details theraband green band     Person(s) Educated Patient   Methods Explanation   Comprehension Verbalized understanding;Need further instruction             PT Long Term Goals - 03/02/17 1402      PT LONG TERM GOAL #1   Title Patient will be independent in safe, self progression of HEP for L ankle and balance.    Status On-going     PT LONG TERM GOAL #2   Title Pt will be able to stand on LLE (and Rt) for min dynamic single leg balance activties without increasing pain in foot.    Status On-going     PT LONG TERM GOAL #3   Title Patient will report taking frequent stretch breaks at work for general fitness and mobility   Status On-going     PT LONG TERM GOAL #4   Title Pt will demo full strength in L ankle without foot pain for maximal gait efficiency.    Status On-going  Plan - 03/02/17 1401    Clinical Impression Statement Pt without pain all weekend and inot today.  Repeat of last visit due to shortened session.     PT Next Visit Plan check  bands for eversion of L foot.  Standing, CKC for balance and stability. repeat tape and ionto? get her back early if able.    PT Home Exercise Plan calf stretch, SLS, green band EV and INV   Consulted and Agree with Plan of Care Patient      Patient will benefit from skilled therapeutic intervention in order to improve the following deficits and impairments:  Decreased balance, Decreased strength, Improper body mechanics, Pain, Decreased mobility  Visit Diagnosis: Pain in left foot     Problem List Patient Active Problem List   Diagnosis Date Noted  . Osteopenia 09/09/2012  . Rosacea 09/09/2012  . HSV infection 09/09/2012    Damek Ende 03/02/2017, 2:05 PM  [facility] [address] [place], [place], [address] Phone: [phone]   Fax:  [phone]  Name: Kimberly Hale MRN: [id number] Date of Birth: 01/01/1956  Raeford Razor, PT 03/02/17 2:05 PM Phone:  [phone] Fax: [phone]

## 2017-03-02 NOTE — Patient Instructions (Signed)
Inversion: Resisted   Cross legs with right leg underneath, foot in tubing loop. Hold tubing around other foot to resist and turn foot in. Repeat __10-20__ times per set. Do __1-2__ sets per session. Do __2__ sessions per day.  http://orth.exer.us/12   Copyright  VHI. All rights reserved.  Eversion: Resisted   With right foot in tubing loop, hold tubing around other foot to resist and turn foot out. Repeat ___10-20_ times per set. Do __1-2__ sets per session. Do _2___ sessions per day.  http://orth.exer.us/14

## 2017-03-04 ENCOUNTER — Ambulatory Visit: Payer: Commercial Managed Care - PPO | Admitting: Physical Therapy

## 2017-03-04 DIAGNOSIS — M79672 Pain in left foot: Secondary | ICD-10-CM | POA: Diagnosis not present

## 2017-03-04 DIAGNOSIS — M25552 Pain in left hip: Secondary | ICD-10-CM | POA: Diagnosis not present

## 2017-03-04 DIAGNOSIS — M6281 Muscle weakness (generalized): Secondary | ICD-10-CM | POA: Diagnosis not present

## 2017-03-04 NOTE — Therapy (Signed)
[facility] [address] [place], [place], [address] Phone: [phone]   Fax:  [phone]  Physical Therapy Treatment  Patient Details  Name: Kimberly Hale MRN: [id number] Date of Birth: 06/14/1956 Referring Provider: Justin Ollis PA and Dr. John Hewitt   Encounter Date: 03/04/2017      PT End of Session - 03/04/17 1755    Visit Number 4   Number of Visits 8   Date for PT Re-Evaluation 03/29/17   PT Start Time 1634   PT Stop Time 1735   PT Time Calculation (min) 61 min   Activity Tolerance Patient tolerated treatment well   Behavior During Therapy [facility] for tasks assessed/performed      Past Medical History:  Diagnosis Date  . Anxiety   . Environmental allergies   . History of ETOH abuse    NONE IN 10 YEARS  . Osteopenia 10/2015   T score -2.4 right femoral neck FRAX 21%/2.2%  . Rosacea     Past Surgical History:  Procedure Laterality Date  . BUNIONECTOMY    . CESAREAN SECTION  1988  . CYSTOSCOPY  1975  . LIPOMA EXCISION  1987  . THERAPEUTIC ABORTION     X2    There were no vitals filed for this visit.                       [facility] Adult PT Treatment/Exercise - 03/04/17 0001      Iontophoresis   Type of Iontophoresis Dexamethasone   Dose 2.5cc   Time 15 min , 40 mA dose (3.0)      Manual Therapy   Manual Therapy Soft tissue mobilization;Myofascial release;Passive ROM   Joint Mobilization Metatarsals, heel with distraction   Soft tissue mobilization calf lateral instrument assist and manual   Myofascial Release pool noodle roller to hamstrings,  calf   Passive ROM calcaneous, toes,  foot   McConnell removed today     Ankle Exercises: Standing   Heel Raises --  Demo verbal to try with toes in.     Ankle Exercises: Stretches   Plantar Fascia Stretch Limitations 4 minutes rollinf tennis ball under foot to stretch fascia.   Gastroc Stretch --  demo stretch to try with toes in  slightly,  no time to do                     PT Long Term Goals - 03/02/17 1402      PT LONG TERM GOAL #1   Title Patient will be independent in safe, self progression of HEP for L ankle and balance.    Status On-going     PT LONG TERM GOAL #2   Title Pt will be able to stand on LLE (and Rt) for min dynamic single leg balance activties without increasing pain in foot.    Status On-going     PT LONG TERM GOAL #3   Title Patient will report taking frequent stretch breaks at work for general fitness and mobility   Status On-going     PT LONG TERM GOAL #4   Title Pt will demo full strength in L ankle without foot pain for maximal gait efficiency.    Status On-going               Plan - 03/04/17 1756    Clinical Impression Statement Patient wearing non supportive foot wear today.  Mild pain due to sitting most of   the day.  Progress toward pain goals with everything we have been doing.   No new goals met,  Calcaneous is stiff LT    PT Next Visit Plan check  bands for eversion of L foot.  Standing, CKC for balance and stability. repeat tape and ionto? get her back early if able.    PT Home Exercise Plan calf stretch, SLS, green band EV and INV   Consulted and Agree with Plan of Care Patient      Patient will benefit from skilled therapeutic intervention in order to improve the following deficits and impairments:  Decreased balance, Decreased strength, Improper body mechanics, Pain, Decreased mobility  Visit Diagnosis: Pain in left foot  Hip pain, left  Muscle weakness of lower extremity     Problem List Patient Active Problem List   Diagnosis Date Noted  . Osteopenia 09/09/2012  . Rosacea 09/09/2012  . HSV infection 09/09/2012    HARRIS,KAREN PTA 03/04/2017, 6:00 PM  [facility] [address] [place], [place], [address] Phone: [phone]   Fax:  [phone]  Name: Kimberly Hale MRN:  [id number] Date of Birth: 06/17/1956   

## 2017-03-08 ENCOUNTER — Encounter: Payer: Self-pay | Admitting: Physical Therapy

## 2017-03-08 ENCOUNTER — Ambulatory Visit: Payer: Commercial Managed Care - PPO | Admitting: Physical Therapy

## 2017-03-08 DIAGNOSIS — M6281 Muscle weakness (generalized): Secondary | ICD-10-CM

## 2017-03-08 DIAGNOSIS — M25552 Pain in left hip: Secondary | ICD-10-CM | POA: Diagnosis not present

## 2017-03-08 DIAGNOSIS — M79672 Pain in left foot: Secondary | ICD-10-CM | POA: Diagnosis not present

## 2017-03-08 NOTE — Therapy (Signed)
[place], [place], [address] Phone: [phone]   Fax:  [phone]  Physical Therapy Treatment  Patient Details  Name: Kimberly Hale MRN: [id number] Date of Birth: Jun 06, 1956 Referring Provider: Mechele Claude PA and Dr. Wylene Simmer   Encounter Date: 03/08/2017      PT End of Session - 03/08/17 1416    Visit Number 5   Number of Visits 8   Date for PT Re-Evaluation 03/29/17   PT Start Time 1332   PT Stop Time 1402   PT Time Calculation (min) 30 min   Activity Tolerance Patient tolerated treatment well   Behavior During Therapy [facility] for tasks assessed/performed      Past Medical History:  Diagnosis Date  . Anxiety   . Environmental allergies   . History of ETOH abuse    NONE IN 10 YEARS  . Osteopenia 10/2015   T score -2.4 right femoral neck FRAX 21%/2.2%  . Rosacea     Past Surgical History:  Procedure Laterality Date  . BUNIONECTOMY    . CESAREAN SECTION  1988  . CYSTOSCOPY  1975  . LIPOMA EXCISION  1987  . THERAPEUTIC ABORTION     X2    There were no vitals filed for this visit.      Subjective Assessment - 03/08/17 1406    Subjective Pain improving.  I have to leave right at 2:00 pm.  Exercises, manual and ionto have really helped.   Currently in Pain? Yes   Pain Score --  No number given,   mild today   Pain Location Foot   Pain Orientation Left   Pain Descriptors / Indicators --  pain   Pain Type Chronic pain   Pain Frequency Intermittent   Aggravating Factors  heel lifts with toe in (1st time it did that  today)   Pain Relieving Factors ice,  Ionto                         [facility] Adult PT Treatment/Exercise - 03/08/17 0001      Iontophoresis   Type of Iontophoresis Dexamethasone   Dose 2.5cc   Time 17 min 20 seconds     Ankle Exercises: Standing   Heel Raises 10 reps  heel lifts foot straight and with toes in (pain for 1st time   Toe Raise  10 reps                     PT Long Term Goals - 03/08/17 1421      PT LONG TERM GOAL #1   Title Patient will be independent in safe, self progression of HEP for L ankle and balance.    Baseline She is working on theses at home   Time 4   Period Weeks   Status On-going     PT LONG TERM GOAL #2   Title Pt will be able to stand on LLE (and Rt) for min dynamic single leg balance activties without increasing pain in foot.    Baseline mild pain with heel lifts lateral foot left today   Time 4   Period Weeks   Status On-going     PT LONG TERM GOAL #3   Title Patient will report taking frequent stretch breaks at work for general fitness and mobility   Time 4   Period Weeks   Status Unable to assess     PT LONG  TERM GOAL #4   Title Pt will demo full strength in L ankle without foot pain for maximal gait efficiency.    Time 4   Period Weeks   Status Unable to assess               Plan - 03/08/17 1417    Clinical Impression Statement Short session due to Ms B[place]ette needed to return to work.  In general pain is improving.,  Today she basically wanted to get the ionto treatment and go.  Progress toward pain goals.  Small blister noted under patch today. Patient has been working on her HEP for balance at hme with other exercises.   PT Next Visit Plan check  bands for eversion of L foot.  Standing, CKC for balance and stability. repeat tape and ionto? get her back early if able.    PT Home Exercise Plan calf stretch, SLS, green band EV and INV   Consulted and Agree with Plan of Care Patient      Patient will benefit from skilled therapeutic intervention in order to improve the following deficits and impairments:  Decreased balance, Decreased strength, Improper body mechanics, Pain, Decreased mobility  Visit Diagnosis: Pain in left foot  Hip pain, left  Muscle weakness of lower extremity     Problem List Patient Active Problem List   Diagnosis Date Noted   . Osteopenia 09/09/2012  . Rosacea 09/09/2012  . HSV infection 09/09/2012    Amirra Herling PTA 03/08/2017, 2:24 PM  [facility] [address] [place], [place], [address] Phone: [phone]   Fax:  [phone]  Name: Kimberly Hale MRN: [id number] Date of Birth: July 07, 1956

## 2017-03-11 ENCOUNTER — Ambulatory Visit: Payer: Commercial Managed Care - PPO | Admitting: Physical Therapy

## 2017-03-11 DIAGNOSIS — M6281 Muscle weakness (generalized): Secondary | ICD-10-CM | POA: Diagnosis not present

## 2017-03-11 DIAGNOSIS — M79672 Pain in left foot: Secondary | ICD-10-CM

## 2017-03-11 DIAGNOSIS — M25552 Pain in left hip: Secondary | ICD-10-CM | POA: Diagnosis not present

## 2017-03-11 NOTE — Therapy (Signed)
[place], [place], [address] Phone: [phone]   Fax:  [phone]  Physical Therapy Treatment  Patient Details  Name: Kimberly Hale MRN: [id number] Date of Birth: [birth year] Referring Provider: Mechele Claude PA and Dr. Wylene Simmer   Encounter Date: 03/11/2017      PT End of Session - 03/11/17 1342    Visit Number 6   Number of Visits 8   Date for PT Re-Evaluation 03/29/17   PT Start Time 3235   PT Stop Time 1355  had to leave early to return to work    PT Time Calculation (min) 30 min   Activity Tolerance Patient tolerated treatment well   Behavior During Therapy [facility] for tasks assessed/performed      Past Medical History:  Diagnosis Date  . Anxiety   . Environmental allergies   . History of ETOH abuse    NONE IN 10 YEARS  . Osteopenia 10/2015   T score -2.4 right femoral neck FRAX 21%/2.2%  . Rosacea     Past Surgical History:  Procedure Laterality Date  . BUNIONECTOMY    . CESAREAN SECTION  1988  . CYSTOSCOPY  1975  . LIPOMA EXCISION  1987  . THERAPEUTIC ABORTION     X2    There were no vitals filed for this visit.      Subjective Assessment - 03/11/17 1325    Subjective My hip is bothering me more than my foot.     Currently in Pain? Yes   Pain Score 1    Pain Location Foot   Pain Orientation Left   Pain Descriptors / Indicators Stabbing;Sharp   Pain Type Chronic pain   Pain Onset More than a month ago   Pain Frequency Intermittent   Aggravating Factors  intermittnet movements    Pain Relieving Factors ice , ionto and massage    Pain Score 6   Pain Location Hip   Pain Orientation Left   Pain Descriptors / Indicators Sharp   Pain Type Chronic pain   Pain Onset More than a month ago   Pain Frequency Constant              [facility] Adult PT Treatment/Exercise - 03/11/17 1341      Iontophoresis   Type of Iontophoresis Dexamethasone   Location L ankle 5th  met   Dose 2.5cc   Time 18:00     Ankle Exercises: Standing   SLS on foam pad, static and min dynamic.  Not painful but needed UE assist.  Performed hip ext x 20, hip abduction x 10 and semi circles with Rt. leg                 PT Education - 03/11/17 1342    Education provided No             PT Long Term Goals - 03/11/17 1344      PT LONG TERM GOAL #1   Title Patient will be independent in safe, self progression of HEP for L ankle and balance.    Status On-going     PT LONG TERM GOAL #2   Title Pt will be able to stand on LLE (and Rt) for min dynamic single leg balance activties without increasing pain in foot.    Status Partially Met     PT LONG TERM GOAL #3   Title Patient will report taking frequent stretch breaks at work for general fitness and  mobility   Status Partially Met     PT LONG TERM GOAL #4   Title Pt will demo full strength in L ankle without foot pain for maximal gait efficiency.    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 Next 2 visits are in PM.  She does feel an improvement in pain with treatment.  Sees MD for hip tomorrow and understands she may be able to come back to address hip and gait if needed post surgery.    PT Next Visit Plan check MMT for ankle,   bands for eversion of L foot.  Standing, CKC for balance and stability. repeat tape and ionto? get her back early if able.    PT Home Exercise Plan calf stretch, SLS, green band EV and INV   Consulted and Agree with Plan of Care Patient      Patient will benefit from skilled therapeutic intervention in order to improve the following deficits and impairments:  Decreased balance, Decreased strength, Improper body mechanics, Pain, Decreased mobility  Visit Diagnosis: Pain in left foot     Problem List Patient Active Problem List   Diagnosis Date Noted  . Osteopenia  09/09/2012  . Rosacea 09/09/2012  . HSV infection 09/09/2012    PAA,JENNIFER 03/11/2017, 1:46 PM  [facility] [address] [place], [place], [address] Phone: [phone]   Fax:  [phone]  Name: Kimberly Hale MRN: [id number] Date of Birth: 1956/11/22  Raeford Razor, PT 03/11/17 1:48 PM Phone: [phone] Fax: [phone]

## 2017-03-12 DIAGNOSIS — G8929 Other chronic pain: Secondary | ICD-10-CM | POA: Diagnosis not present

## 2017-03-12 DIAGNOSIS — M25552 Pain in left hip: Secondary | ICD-10-CM | POA: Diagnosis not present

## 2017-03-12 DIAGNOSIS — M7062 Trochanteric bursitis, left hip: Secondary | ICD-10-CM | POA: Diagnosis not present

## 2017-03-15 ENCOUNTER — Ambulatory Visit: Payer: Commercial Managed Care - PPO | Admitting: Physical Therapy

## 2017-03-15 DIAGNOSIS — M25552 Pain in left hip: Secondary | ICD-10-CM | POA: Diagnosis not present

## 2017-03-15 DIAGNOSIS — M79672 Pain in left foot: Secondary | ICD-10-CM

## 2017-03-15 DIAGNOSIS — M6281 Muscle weakness (generalized): Secondary | ICD-10-CM | POA: Diagnosis not present

## 2017-03-15 NOTE — Patient Instructions (Signed)
Balance exercises in Corner on pillow:  Tandem Rhomboid(Feet together) Single leg  Challenge balance by: Moving head - right/ Left /up/ down Reaching with arms Shifting weight from foot to foot Eyes open/ closed   Place a chair in front for safety if needed.

## 2017-03-15 NOTE — Therapy (Addendum)
[place], [place], [address] Phone: [phone]   Fax:  [phone]  Physical Therapy Treatment and Discharge (Addendum)   Patient Details  Name: Kimberly Hale MRN: [id number] Date of Birth: [birth year] Referring Provider: Mechele Claude PA and Dr. Wylene Simmer   Encounter Date: 03/15/2017      PT End of Session - 03/15/17 1715    Visit Number 7   Number of Visits 8   Date for PT Re-Evaluation 03/29/17   PT Start Time 1633   PT Stop Time 1706   PT Time Calculation (min) 33 min   Activity Tolerance Patient tolerated treatment well   Behavior During Therapy [facility] for tasks assessed/performed      Past Medical History:  Diagnosis Date  . Anxiety   . Environmental allergies   . History of ETOH abuse    NONE IN 10 YEARS  . Osteopenia 10/2015   T score -2.4 right femoral neck FRAX 21%/2.2%  . Rosacea     Past Surgical History:  Procedure Laterality Date  . BUNIONECTOMY    . CESAREAN SECTION  1988  . CYSTOSCOPY  1975  . LIPOMA EXCISION  1987  . THERAPEUTIC ABORTION     X2    There were no vitals filed for this visit.      Subjective Assessment - 03/15/17 1645    Subjective No pain in 3 days            [facility] PT Assessment - 03/15/17 0001      Observation/Other Assessments   Focus on Therapeutic Outcomes (FOTO)  1% limitation    99% ability     Strength   Left Ankle Dorsiflexion 5/5   Left Ankle Plantar Flexion 5/5   Left Ankle Inversion 5/5   Left Ankle Eversion 5/5                     [facility] Adult PT Treatment/Exercise - 03/15/17 0001      High Level Balance   High Level Balance Activities Other (comment)  Fall prevention handouts issued from exercise drawer X2   High Level Balance Comments In corner on foam pad: Rhomberg and tandem standing and single leg standing..  These positions challanged with head movements, arm movements and weight shifting.  HEP      Ankle Exercises: Seated   Other Seated Ankle Exercises 10 x each IV/EV/DF green band     Ankle Exercises: Standing   Heel Raises --  25 reps single leg                PT Education - 03/15/17 1715    Education provided Yes   Education Details Balance exercises,  Fall prevention Handouts   Person(s) Educated Patient   Methods Explanation;Demonstration;Tactile cues;Verbal cues;Handout   Comprehension Verbalized understanding;Returned demonstration             PT Long Term Goals - 03/15/17 1722      PT LONG TERM GOAL #1   Title Patient will be independent in safe, self progression of HEP for L ankle and balance.    Baseline independent with exercises issued so far.   Time 4   Period Weeks   Status On-going     PT LONG TERM GOAL #2   Title Pt will be able to stand on LLE (and Rt) for min dynamic single leg balance activties without increasing pain in foot.    Baseline able.  No  pain   Time 4   Period Weeks   Status Achieved     PT LONG TERM GOAL #3   Title Patient will report taking frequent stretch breaks at work for general fitness and mobility   Baseline yes.    Time 4   Period Weeks   Status Achieved     PT LONG TERM GOAL #4   Title Pt will demo full strength in L ankle without foot pain for maximal gait efficiency.    Baseline 5/5   Time 4   Period Weeks   Status Achieved               Plan - 03/15/17 1716    Clinical Impression Statement Patient has no pain for 3 days.  She is pleased.  She is compliant with her HEP.  Strength left foot 5/5 today.  Patient is independent with exercises issued so far.  Patient plans to keep next appointment if her pain returns, otherwise she will call to cancel that morning if he pain does not return.  FOTO 1% limitation. LTG #2, #3 #4 met.No ionto needed today.   PT Next Visit Plan Patient to return next visit if pain returns. If no pain patient will cancel.  Review balance exercises issued today.   PT Home  Exercise Plan calf stretch, SLS, green band EV and INV.  Standing balance exercises on pillow in corner.   Consulted and Agree with Plan of Care Patient      Patient will benefit from skilled therapeutic intervention in order to improve the following deficits and impairments:  Decreased balance, Decreased strength, Improper body mechanics, Pain, Decreased mobility  Visit Diagnosis: Pain in left foot  Hip pain, left  Muscle weakness of lower extremity     Problem List Patient Active Problem List   Diagnosis Date Noted  . Osteopenia 09/09/2012  . Rosacea 09/09/2012  . HSV infection 09/09/2012    Meziah Blasingame PTA 03/15/2017, 5:25 PM  [facility] [address] [place], [place], [address] Phone: [phone]   Fax:  [phone]  Name: Kimberly Hale MRN: [id number] Date of Birth: 02-Aug-1956   PHYSICAL THERAPY DISCHARGE SUMMARY  Visits from Start of Care: 7  Current functional level related to goals / functional outcomes: See above, only 1% limited per FOTO    Remaining deficits: None limiting function.    Education / Equipment: Ionto, balance, stretching  Plan: Patient agrees to discharge.  Patient goals were met. Patient is being discharged due to meeting the stated rehab goals.  ?????   Raeford Razor, PT 03/17/17 1:36 PM Phone: [phone] Fax: [phone]

## 2017-03-17 ENCOUNTER — Ambulatory Visit: Payer: Commercial Managed Care - PPO | Admitting: Physical Therapy

## 2017-03-24 DIAGNOSIS — M7062 Trochanteric bursitis, left hip: Secondary | ICD-10-CM | POA: Diagnosis not present

## 2017-04-16 DIAGNOSIS — M7062 Trochanteric bursitis, left hip: Secondary | ICD-10-CM | POA: Diagnosis not present

## 2017-04-22 NOTE — Progress Notes (Signed)
Please place orders in EPIC as patient is being scheduled for a pre-op appointment! Thank you! 

## 2017-04-23 ENCOUNTER — Ambulatory Visit: Payer: Self-pay | Admitting: Orthopedic Surgery

## 2017-04-28 ENCOUNTER — Other Ambulatory Visit (HOSPITAL_COMMUNITY): Payer: Self-pay | Admitting: Emergency Medicine

## 2017-04-28 NOTE — Patient Instructions (Addendum)
Kimberly Hale  04/28/2017   Your procedure is scheduled on: 05-05-17  Report to [facility] Main  Entrance    Report to admitting at 1:00PM   Call this number if you have problems the morning of surgery [phone]   Remember: ONLY 1 PERSON MAY GO WITH YOU TO SHORT STAY TO GET  READY MORNING OF YOUR SURGERY.  Do not eat food After Midnight. You may have clear liquids from midnight until 7am day of surgery. Nothing by mouth after 7am!!     Take these medicines the morning of surgery with A SIP OF WATER: buspar                                You may not have any metal on your body including hair pins and              piercings  Do not wear jewelry, make-up, lotions, powders or perfumes, deodorant             Do not wear nail polish.  Do not shave  48 hours prior to surgery.              Do not bring valuables to the hospital. [place].  Contacts, dentures or bridgework may not be worn into surgery.  Leave suitcase in the car. After surgery it may be brought to your room.                Please read over the following fact sheets you were given: _____________________________________________________________________         CLEAR LIQUID DIET   Foods Allowed                                                                     Foods Excluded  Coffee and tea, regular and decaf                             liquids that you cannot  Plain Jell-O in any flavor                                             see through such as: Fruit ices (not with fruit pulp)                                     milk, soups, orange juice  Iced Popsicles                                    All solid food Carbonated beverages, regular and diet  Cranberry, grape and apple juices Sports drinks like Gatorade Lightly seasoned clear broth or consume(fat free) Sugar, honey syrup  Sample  Menu Breakfast                                Lunch                                     Supper Cranberry juice                    Beef broth                            Chicken broth Jell-O                                     Grape juice                           Apple juice Coffee or tea                        Jell-O                                      Popsicle                                                Coffee or tea                        Coffee or tea  _____________________________________________________________________  [facility] - Preparing for Surgery Before surgery, you can play an important role.  Because skin is not sterile, your skin needs to be as free of germs as possible.  You can reduce the number of germs on your skin by washing with CHG (chlorahexidine gluconate) soap before surgery.  CHG is an antiseptic cleaner which kills germs and bonds with the skin to continue killing germs even after washing. Please DO NOT use if you have an allergy to CHG or antibacterial soaps.  If your skin becomes reddened/irritated stop using the CHG and inform your nurse when you arrive at Short Stay. Do not shave (including legs and underarms) for at least 48 hours prior to the first CHG shower.  You may shave your face/neck. Please follow these instructions carefully:  1.  Shower with CHG Soap the night before surgery and the  morning of Surgery.  2.  If you choose to wash your hair, wash your hair first as usual with your  normal  shampoo.  3.  After you shampoo, rinse your hair and body thoroughly to remove the  shampoo.                           4.  Use CHG as you would any other liquid soap.  You can apply chg directly  to the skin and wash  Gently with a scrungie or clean washcloth.  5.  Apply the CHG Soap to your body ONLY FROM THE NECK DOWN.   Do not use on face/ open                           Wound or open sores. Avoid contact with eyes, ears mouth and genitals  (private parts).                       Wash face,  Genitals (private parts) with your normal soap.             6.  Wash thoroughly, paying special attention to the area where your surgery  will be performed.  7.  Thoroughly rinse your body with warm water from the neck down.  8.  DO NOT shower/wash with your normal soap after using and rinsing off  the CHG Soap.                9.  Pat yourself dry with a clean towel.            10.  Wear clean pajamas.            11.  Place clean sheets on your bed the night of your first shower and do not  sleep with pets. Day of Surgery : Do not apply any lotions/deodorants the morning of surgery.  Please wear clean clothes to the hospital/surgery center.  FAILURE TO FOLLOW THESE INSTRUCTIONS MAY RESULT IN THE CANCELLATION OF YOUR SURGERY PATIENT SIGNATURE_________________________________  NURSE SIGNATURE__________________________________  ________________________________________________________________________   Kimberly Hale  An incentive spirometer is a tool that can help keep your lungs clear and active. This tool measures how well you are filling your lungs with each breath. Taking long deep breaths may help reverse or decrease the chance of developing breathing (pulmonary) problems (especially infection) following:  A long period of time when you are unable to move or be active. BEFORE THE PROCEDURE   If the spirometer includes an indicator to show your best effort, your nurse or respiratory therapist will set it to a desired goal.  If possible, sit up straight or lean slightly forward. Try not to slouch.  Hold the incentive spirometer in an upright position. INSTRUCTIONS FOR USE  1. Sit on the edge of your bed if possible, or sit up as far as you can in bed or on a chair. 2. Hold the incentive spirometer in an upright position. 3. Breathe out normally. 4. Place the mouthpiece in your mouth and seal your lips tightly around  it. 5. Breathe in slowly and as deeply as possible, raising the piston or the ball toward the top of the column. 6. Hold your breath for 3-5 seconds or for as long as possible. Allow the piston or ball to fall to the bottom of the column. 7. Remove the mouthpiece from your mouth and breathe out normally. 8. Rest for a few seconds and repeat Steps 1 through 7 at least 10 times every 1-2 hours when you are awake. Take your time and take a few normal breaths between deep breaths. 9. The spirometer may include an indicator to show your best effort. Use the indicator as a goal to work toward during each repetition. 10. After each set of 10 deep breaths, practice coughing to be sure your lungs are clear. If you have an incision (the cut made at the time of  surgery), support your incision when coughing by placing a pillow or rolled up towels firmly against it. Once you are able to get out of bed, walk around indoors and cough well. You may stop using the incentive spirometer when instructed by your caregiver.  RISKS AND COMPLICATIONS  Take your time so you do not get dizzy or light-headed.  If you are in pain, you may need to take or ask for pain medication before doing incentive spirometry. It is harder to take a deep breath if you are having pain. AFTER USE  Rest and breathe slowly and easily.  It can be helpful to keep track of a log of your progress. Your caregiver can provide you with a simple table to help with this. If you are using the spirometer at home, follow these instructions: [place] IF:   You are having difficultly using the spirometer.  You have trouble using the spirometer as often as instructed.  Your pain medication is not giving enough relief while using the spirometer.  You develop fever of 100.5 F (38.1 C) or higher. SEEK IMMEDIATE MEDICAL CARE IF:   You cough up bloody sputum that had not been present before.  You develop fever of 102 F (38.9 C) or  greater.  You develop worsening pain at or near the incision site. MAKE SURE YOU:   Understand these instructions.  Will watch your condition.  Will get help right away if you are not doing well or get worse. Document Released: 03/22/2007 Document Revised: 02/01/2012 Document Reviewed: 05/23/2007 [facility] Patient Information 2014 [place], [place].   ________________________________________________________________________

## 2017-04-30 ENCOUNTER — Encounter (HOSPITAL_COMMUNITY): Payer: Self-pay

## 2017-04-30 ENCOUNTER — Encounter (HOSPITAL_COMMUNITY)
Admission: RE | Admit: 2017-04-30 | Discharge: 2017-04-30 | Disposition: A | Discharge status: Home / Self Care | Payer: Commercial Managed Care - PPO | Source: Ambulatory Visit | Attending: Orthopedic Surgery | Admitting: Orthopedic Surgery

## 2017-04-30 DIAGNOSIS — M7072 Other bursitis of hip, left hip: Secondary | ICD-10-CM | POA: Diagnosis not present

## 2017-04-30 DIAGNOSIS — Z01812 Encounter for preprocedural laboratory examination: Secondary | ICD-10-CM | POA: Insufficient documentation

## 2017-04-30 HISTORY — DX: Transient acantholytic dermatosis (grover): L11.1

## 2017-04-30 HISTORY — DX: Pneumonia, unspecified organism: J18.9

## 2017-04-30 HISTORY — DX: Headache, unspecified: R51.9

## 2017-04-30 HISTORY — DX: Gastro-esophageal reflux disease without esophagitis: K21.9

## 2017-04-30 HISTORY — DX: Headache: R51

## 2017-04-30 LAB — CBC
HCT: 42.7 % (ref 36.0–46.0)
HEMOGLOBIN: 15.1 g/dL — AB (ref 12.0–15.0)
MCH: 30.4 pg (ref 26.0–34.0)
MCHC: 35.4 g/dL (ref 30.0–36.0)
MCV: 85.9 fL (ref 78.0–100.0)
PLATELETS: 222 10*3/uL (ref 150–400)
RBC: 4.97 MIL/uL (ref 3.87–5.11)
RDW: 12.6 % (ref 11.5–15.5)
WBC: 4.3 10*3/uL (ref 4.0–10.5)

## 2017-04-30 LAB — BASIC METABOLIC PANEL
Anion gap: 8 (ref 5–15)
BUN: 10 mg/dL (ref 6–20)
CO2: 29 mmol/L (ref 22–32)
CREATININE: 0.82 mg/dL (ref 0.44–1.00)
Calcium: 9.4 mg/dL (ref 8.9–10.3)
Chloride: 104 mmol/L (ref 101–111)
Glucose, Bld: 98 mg/dL (ref 65–99)
Potassium: 4.1 mmol/L (ref 3.5–5.1)
SODIUM: 141 mmol/L (ref 135–145)

## 2017-04-30 LAB — SURGICAL PCR SCREEN
MRSA, PCR: NEGATIVE
STAPHYLOCOCCUS AUREUS: NEGATIVE

## 2017-05-05 ENCOUNTER — Encounter (HOSPITAL_COMMUNITY)
Admission: RE | Disposition: A | Discharge status: Home / Self Care | Payer: Self-pay | Source: Ambulatory Visit | Attending: Orthopedic Surgery

## 2017-05-05 ENCOUNTER — Ambulatory Visit (HOSPITAL_COMMUNITY): Payer: Commercial Managed Care - PPO | Admitting: Anesthesiology

## 2017-05-05 ENCOUNTER — Ambulatory Visit (HOSPITAL_COMMUNITY)
Admission: RE | Admit: 2017-05-05 | Discharge: 2017-05-05 | Disposition: A | Discharge status: Home / Self Care | Payer: Commercial Managed Care - PPO | Source: Ambulatory Visit | Attending: Orthopedic Surgery | Admitting: Orthopedic Surgery

## 2017-05-05 ENCOUNTER — Encounter (HOSPITAL_COMMUNITY): Payer: Self-pay

## 2017-05-05 DIAGNOSIS — M25552 Pain in left hip: Secondary | ICD-10-CM | POA: Diagnosis present

## 2017-05-05 DIAGNOSIS — S76012A Strain of muscle, fascia and tendon of left hip, initial encounter: Secondary | ICD-10-CM | POA: Insufficient documentation

## 2017-05-05 DIAGNOSIS — F419 Anxiety disorder, unspecified: Secondary | ICD-10-CM | POA: Insufficient documentation

## 2017-05-05 DIAGNOSIS — Z7951 Long term (current) use of inhaled steroids: Secondary | ICD-10-CM | POA: Diagnosis not present

## 2017-05-05 DIAGNOSIS — X58XXXA Exposure to other specified factors, initial encounter: Secondary | ICD-10-CM | POA: Diagnosis not present

## 2017-05-05 DIAGNOSIS — M7072 Other bursitis of hip, left hip: Secondary | ICD-10-CM | POA: Diagnosis not present

## 2017-05-05 DIAGNOSIS — M7062 Trochanteric bursitis, left hip: Secondary | ICD-10-CM | POA: Diagnosis not present

## 2017-05-05 DIAGNOSIS — Z79899 Other long term (current) drug therapy: Secondary | ICD-10-CM | POA: Insufficient documentation

## 2017-05-05 DIAGNOSIS — K219 Gastro-esophageal reflux disease without esophagitis: Secondary | ICD-10-CM | POA: Insufficient documentation

## 2017-05-05 DIAGNOSIS — M25652 Stiffness of left hip, not elsewhere classified: Secondary | ICD-10-CM | POA: Diagnosis not present

## 2017-05-05 DIAGNOSIS — Z87891 Personal history of nicotine dependence: Secondary | ICD-10-CM | POA: Diagnosis not present

## 2017-05-05 HISTORY — PX: OPEN SURGICAL REPAIR OF GLUTEAL TENDON: SHX5995

## 2017-05-05 SURGERY — REPAIR, TENDON, GLUTEUS MEDIUS, OPEN
Anesthesia: General | Site: Hip | Laterality: Left

## 2017-05-05 MED ORDER — BUPIVACAINE HCL (PF) 0.25 % IJ SOLN
INTRAMUSCULAR | Status: DC | PRN
  Administered 2017-05-05: 30 mL

## 2017-05-05 MED ORDER — SODIUM CHLORIDE 0.9 % IR SOLN
Status: DC | PRN
  Administered 2017-05-05: 1000 mL

## 2017-05-05 MED ORDER — ROCURONIUM BROMIDE 100 MG/10ML IV SOLN
INTRAVENOUS | Status: DC | PRN
  Administered 2017-05-05: 40 mg via INTRAVENOUS

## 2017-05-05 MED ORDER — DEXAMETHASONE SODIUM PHOSPHATE 10 MG/ML IJ SOLN
INTRAMUSCULAR | Status: AC
  Filled 2017-05-05: qty 1

## 2017-05-05 MED ORDER — BUPIVACAINE LIPOSOME 1.3 % IJ SUSP
INTRAMUSCULAR | Status: DC | PRN
  Administered 2017-05-05: 20 mL

## 2017-05-05 MED ORDER — ONDANSETRON HCL 4 MG/2ML IJ SOLN
INTRAMUSCULAR | Status: DC | PRN
  Administered 2017-05-05: 4 mg via INTRAVENOUS

## 2017-05-05 MED ORDER — HYDROMORPHONE HCL 1 MG/ML IJ SOLN
INTRAMUSCULAR | Status: AC
  Administered 2017-05-05: 0.5 mg via INTRAVENOUS
  Filled 2017-05-05: qty 0.5

## 2017-05-05 MED ORDER — CEFAZOLIN SODIUM-DEXTROSE 2-4 GM/100ML-% IV SOLN
2.0000 g | Freq: Once | INTRAVENOUS | Status: AC
  Administered 2017-05-05: 2 g via INTRAVENOUS

## 2017-05-05 MED ORDER — SUGAMMADEX SODIUM 200 MG/2ML IV SOLN
INTRAVENOUS | Status: AC
  Filled 2017-05-05: qty 2

## 2017-05-05 MED ORDER — HYDROMORPHONE HCL 1 MG/ML IJ SOLN
0.2500 mg | INTRAMUSCULAR | Status: DC | PRN
  Administered 2017-05-05 (×3): 0.5 mg via INTRAVENOUS

## 2017-05-05 MED ORDER — SODIUM CHLORIDE 0.9 % IJ SOLN
INTRAMUSCULAR | Status: DC | PRN
  Administered 2017-05-05: 20 mL

## 2017-05-05 MED ORDER — MIDAZOLAM HCL 2 MG/2ML IJ SOLN
INTRAMUSCULAR | Status: AC
  Filled 2017-05-05: qty 2

## 2017-05-05 MED ORDER — LIDOCAINE 2% (20 MG/ML) 5 ML SYRINGE
INTRAMUSCULAR | Status: AC
  Filled 2017-05-05: qty 5

## 2017-05-05 MED ORDER — ONDANSETRON HCL 4 MG/2ML IJ SOLN
INTRAMUSCULAR | Status: AC
  Filled 2017-05-05: qty 2

## 2017-05-05 MED ORDER — ACETAMINOPHEN 10 MG/ML IV SOLN
1000.0000 mg | Freq: Once | INTRAVENOUS | Status: AC
  Administered 2017-05-05: 1000 mg via INTRAVENOUS

## 2017-05-05 MED ORDER — SUGAMMADEX SODIUM 200 MG/2ML IV SOLN
INTRAVENOUS | Status: DC | PRN
  Administered 2017-05-05: 150 mg via INTRAVENOUS

## 2017-05-05 MED ORDER — OXYCODONE HCL 5 MG/5ML PO SOLN
5.0000 mg | Freq: Once | ORAL | Status: AC | PRN
  Filled 2017-05-05: qty 5

## 2017-05-05 MED ORDER — DEXAMETHASONE SODIUM PHOSPHATE 10 MG/ML IJ SOLN
10.0000 mg | Freq: Once | INTRAMUSCULAR | Status: AC
  Administered 2017-05-05: 10 mg via INTRAVENOUS

## 2017-05-05 MED ORDER — FENTANYL CITRATE (PF) 100 MCG/2ML IJ SOLN
INTRAMUSCULAR | Status: DC | PRN
  Administered 2017-05-05 (×2): 50 ug via INTRAVENOUS
  Administered 2017-05-05: 100 ug via INTRAVENOUS
  Administered 2017-05-05: 50 ug via INTRAVENOUS

## 2017-05-05 MED ORDER — FENTANYL CITRATE (PF) 250 MCG/5ML IJ SOLN
INTRAMUSCULAR | Status: AC
  Filled 2017-05-05: qty 5

## 2017-05-05 MED ORDER — HYDROMORPHONE HCL 1 MG/ML IJ SOLN
INTRAMUSCULAR | Status: AC
  Filled 2017-05-05: qty 0.5

## 2017-05-05 MED ORDER — PROPOFOL 10 MG/ML IV BOLUS
INTRAVENOUS | Status: AC
  Filled 2017-05-05: qty 20

## 2017-05-05 MED ORDER — POVIDONE-IODINE 10 % EX SWAB
2.0000 "application " | Freq: Once | CUTANEOUS | Status: AC
  Administered 2017-05-05: 2 via TOPICAL

## 2017-05-05 MED ORDER — CEFAZOLIN SODIUM-DEXTROSE 2-4 GM/100ML-% IV SOLN
INTRAVENOUS | Status: AC
  Filled 2017-05-05: qty 100

## 2017-05-05 MED ORDER — PROMETHAZINE HCL 25 MG/ML IJ SOLN: 6.2500 mg | INTRAMUSCULAR | Status: DC | PRN

## 2017-05-05 MED ORDER — METHOCARBAMOL 500 MG PO TABS: 500.0000 mg | ORAL_TABLET | Freq: Four times a day (QID) | ORAL | 1 refills | Status: DC

## 2017-05-05 MED ORDER — ROCURONIUM BROMIDE 50 MG/5ML IV SOSY
PREFILLED_SYRINGE | INTRAVENOUS | Status: AC
  Filled 2017-05-05: qty 5

## 2017-05-05 MED ORDER — OXYCODONE HCL 5 MG PO TABS
ORAL_TABLET | ORAL | Status: AC
  Administered 2017-05-05: 5 mg via ORAL
  Filled 2017-05-05: qty 1

## 2017-05-05 MED ORDER — HYDROMORPHONE HCL 1 MG/ML IJ SOLN
INTRAMUSCULAR | Status: AC
  Administered 2017-05-05: 0.5 mg via INTRAVENOUS
  Filled 2017-05-05: qty 1

## 2017-05-05 MED ORDER — HYDROCODONE-ACETAMINOPHEN 5-325 MG PO TABS: 1.0000 | ORAL_TABLET | ORAL | 0 refills | Status: DC | PRN

## 2017-05-05 MED ORDER — BUPIVACAINE LIPOSOME 1.3 % IJ SUSP
20.0000 mL | Freq: Once | INTRAMUSCULAR | Status: DC
  Filled 2017-05-05: qty 20

## 2017-05-05 MED ORDER — BUPIVACAINE HCL (PF) 0.25 % IJ SOLN
INTRAMUSCULAR | Status: AC
  Filled 2017-05-05: qty 30

## 2017-05-05 MED ORDER — LACTATED RINGERS IV SOLN
INTRAVENOUS | Status: DC
  Administered 2017-05-05: 1000 mL via INTRAVENOUS

## 2017-05-05 MED ORDER — PROPOFOL 10 MG/ML IV BOLUS
INTRAVENOUS | Status: DC | PRN
  Administered 2017-05-05: 140 mg via INTRAVENOUS

## 2017-05-05 MED ORDER — CHLORHEXIDINE GLUCONATE 4 % EX LIQD: 60.0000 mL | Freq: Once | CUTANEOUS | Status: DC

## 2017-05-05 MED ORDER — LIDOCAINE HCL (CARDIAC) 20 MG/ML IV SOLN
INTRAVENOUS | Status: DC | PRN
  Administered 2017-05-05: 50 mg via INTRAVENOUS

## 2017-05-05 MED ORDER — MEPERIDINE HCL 50 MG/ML IJ SOLN: 6.2500 mg | INTRAMUSCULAR | Status: DC | PRN

## 2017-05-05 MED ORDER — SODIUM CHLORIDE 0.9 % IJ SOLN
INTRAMUSCULAR | Status: AC
  Filled 2017-05-05: qty 50

## 2017-05-05 MED ORDER — ACETAMINOPHEN 10 MG/ML IV SOLN
INTRAVENOUS | Status: AC
  Filled 2017-05-05: qty 100

## 2017-05-05 MED ORDER — MIDAZOLAM HCL 5 MG/5ML IJ SOLN
INTRAMUSCULAR | Status: DC | PRN
  Administered 2017-05-05: 2 mg via INTRAVENOUS

## 2017-05-05 MED ORDER — OXYCODONE HCL 5 MG PO TABS
5.0000 mg | ORAL_TABLET | Freq: Once | ORAL | Status: AC | PRN
  Administered 2017-05-05: 5 mg via ORAL

## 2017-05-05 SURGICAL SUPPLY — 35 items
ANCHOR SUPER QUICK (Anchor) ×2 IMPLANT
BAG ZIPLOCK 12X15 (MISCELLANEOUS) IMPLANT
BLADE EXTENDED COATED 6.5IN (ELECTRODE) IMPLANT
COVER SURGICAL LIGHT HANDLE (MISCELLANEOUS) ×2 IMPLANT
DRAPE INCISE IOBAN 66X45 STRL (DRAPES) ×2 IMPLANT
DRAPE ORTHO SPLIT 77X108 STRL (DRAPES) ×2
DRAPE POUCH INSTRU U-SHP 10X18 (DRAPES) ×2 IMPLANT
DRAPE SURG ORHT 6 SPLT 77X108 (DRAPES) ×2 IMPLANT
DRAPE U-SHAPE 47X51 STRL (DRAPES) ×2 IMPLANT
DRSG ADAPTIC 3X8 NADH LF (GAUZE/BANDAGES/DRESSINGS) ×2 IMPLANT
DRSG MEPILEX BORDER 4X8 (GAUZE/BANDAGES/DRESSINGS) ×2 IMPLANT
ELECT REM PT RETURN 15FT ADLT (MISCELLANEOUS) ×2 IMPLANT
GAUZE SPONGE 4X4 12PLY STRL (GAUZE/BANDAGES/DRESSINGS) ×2 IMPLANT
GLOVE BIO SURGEON STRL SZ7.5 (GLOVE) ×2 IMPLANT
GLOVE BIO SURGEON STRL SZ8 (GLOVE) ×2 IMPLANT
GLOVE BIOGEL PI IND STRL 8 (GLOVE) ×2 IMPLANT
GLOVE BIOGEL PI INDICATOR 8 (GLOVE) ×2
GOWN STRL REUS W/TWL LRG LVL3 (GOWN DISPOSABLE) ×2 IMPLANT
GOWN STRL REUS W/TWL XL LVL3 (GOWN DISPOSABLE) ×2 IMPLANT
KIT BASIN OR (CUSTOM PROCEDURE TRAY) ×2 IMPLANT
MANIFOLD NEPTUNE II (INSTRUMENTS) ×2 IMPLANT
NDL SAFETY ECLIPSE 18X1.5 (NEEDLE) ×2 IMPLANT
NEEDLE HYPO 18GX1.5 SHARP (NEEDLE) ×2
NS IRRIG 1000ML POUR BTL (IV SOLUTION) ×2 IMPLANT
PACK TOTAL JOINT (CUSTOM PROCEDURE TRAY) ×2 IMPLANT
POSITIONER SURGICAL ARM (MISCELLANEOUS) ×2 IMPLANT
STAPLER VISISTAT 35W (STAPLE) IMPLANT
STRIP CLOSURE SKIN 1/2X4 (GAUZE/BANDAGES/DRESSINGS) ×2 IMPLANT
SUT MNCRL AB 4-0 PS2 18 (SUTURE) ×2 IMPLANT
SUT VIC AB 2-0 CT1 27 (SUTURE) ×2
SUT VIC AB 2-0 CT1 TAPERPNT 27 (SUTURE) ×2 IMPLANT
SUT VLOC 180 0 24IN GS25 (SUTURE) ×2 IMPLANT
SYR 20CC LL (SYRINGE) ×2 IMPLANT
SYR 50ML LL SCALE MARK (SYRINGE) ×2 IMPLANT
TOWEL OR 17X26 10 PK STRL BLUE (TOWEL DISPOSABLE) ×4 IMPLANT

## 2017-05-05 NOTE — Interval H&P Note (Signed)
History and Physical Interval Note:  05/05/2017 2:48 PM  Kimberly Hale  has presented today for surgery, with the diagnosis of Left hip intractable bursitis; gluteal tendon tear   The various methods of treatment have been discussed with the patient and family. After consideration of risks, benefits and other options for treatment, the patient has consented to  Procedure(s): Left hip bursectomy; gluteal tendon repair (Left) as a surgical intervention .  The patient's history has been reviewed, patient examined, no change in status, stable for surgery.  I have reviewed the patient's chart and labs.  Questions were answered to the patient's satisfaction.     Gearlean Alf

## 2017-05-05 NOTE — H&P (Signed)
CC- EVELYN AGUINALDO is a 61 y.o. female who presents with left hip pain  Hip Pain: Patient complains of left hip pain. Onset of the symptoms was several months ago. Inciting event: none. Current symptoms include lateral hip pain worse upon lying on left side and sitting..She has chronic trochanteric bursitis which has not responded to injections and therapy. Recent MRI showed gluteal tendon tear. SHe presents now for bursectomy and tendon repair  Past Medical History:  Diagnosis Date  . Anxiety   . Cancer ([place])    skin cancers that have been excised  . Environmental allergies   . GERD (gastroesophageal reflux disease)   . Grover's disease    flares up intermittently ; more flares during cold season; presents on the trunk of body per patient   . Headache    tension headaches occasionally   . History of ETOH abuse    NONE IN 10 YEARS  . Osteopenia 10/2015   T score -2.4 right femoral neck FRAX 21%/2.2%  . Pneumonia    x3 ; twice in teens; once in late 30s/40s  . Rosacea     Past Surgical History:  Procedure Laterality Date  . ARTHRODESIS FOOT WITH WEIL OSTEOTOMY AND CHILECTOMY Right   . BUNIONECTOMY    . CESAREAN SECTION  1988  . CYSTOSCOPY  1975  . LIPOMA EXCISION  1987  . THERAPEUTIC ABORTION     X2  . TONSILLECTOMY      Prior to Admission medications   Medication Sig Start Date End Date Taking? Authorizing Provider  ALPRAZolam Duanne Moron) 0.25 MG tablet Take 1 tablet (0.25 mg total) by mouth at bedtime as needed for sleep. 10/04/15  Yes Huel Cote, NP  busPIRone (BUSPAR) 15 MG tablet Take 7.5-15 mg by mouth See admin instructions. 7.5 mg in the morning, and 15 mg at bedtime   Yes [provider]  Butalbital-APAP-Caffeine (FIORICET) 50-300-40 MG CAPS Take 1 tablet by mouth daily as needed.   Yes [provider]  cholecalciferol (VITAMIN D) 1000 UNITS tablet Take 1,000 Units by mouth daily.     Yes [provider]  DiphenhydrAMINE HCl  (ZZZQUIL) 50 MG/30ML LIQD Take 15 mLs by mouth at bedtime as needed.   Yes [provider]  docusate sodium (COLACE) 100 MG capsule Take 200 mg by mouth at bedtime.   Yes [provider]  famotidine (PEPCID) 20 MG tablet Take 20 mg by mouth at bedtime.    Yes [provider]  fluticasone (FLONASE) 50 MCG/ACT nasal spray Place 1 spray into the nose at bedtime.    Yes [provider]  metroNIDAZOLE (METROCREAM) 0.75 % cream Apply 1 application topically 2 (two) times daily.   Yes [provider]  Multiple Vitamin (MULTIVITAMIN) tablet Take 1 tablet by mouth daily.     Yes [provider]  omega-3 acid ethyl esters (LOVAZA) 1 G capsule Take 1 g by mouth daily.    Yes [provider]  senna (SENOKOT) 8.6 MG TABS tablet Take 1 tablet by mouth daily as needed for mild constipation.   Yes [provider]  valACYclovir (VALTREX) 500 MG tablet Take 1 tablet twice daily for 3-5 days Patient taking differently: Take 500 mg by mouth 2 (two) times daily as needed. Take 1 tablet twice daily for 3-5 days as needed 10/14/16  Yes Young, Candiss Norse, NP  PRESCRIPTION MEDICATION Apply 0.1 % topically daily as needed (Trial).    [provider]  Physical Examination: General appearance - alert, well appearing, and in no distress Mental status - alert, oriented to person, place, and time Chest - clear to auscultation, no wheezes, rales or rhonchi, symmetric air entry Heart - normal rate, regular rhythm, normal S1, S2, no murmurs, rubs, clicks or gallops Abdomen - soft, nontender, nondistended, no masses or organomegaly Neurological - alert, oriented, normal speech, no focal findings or movement disorder noted  A left hip exam was performed. GENERAL: no acute distress SKIN: intact SWELLING: none WARMTH: no warmth TENDERNESS: maximal at greater trochanter ROM: normal STRENGTH: 4/5 hip abduction otherwise normal GAIT:  antalgic  ASSESSMENT:Left hip intractable bursitis with gluteal tendon tear  Plan  Left hp bursectomy and gluteal tendon repair. We discussed this in detail with the patient who elects to proceed  Dione Plover. Dollie Mayse, MD    05/05/2017, 1:41 PM

## 2017-05-05 NOTE — Discharge Instructions (Signed)
General Anesthesia, Adult, Care After These instructions provide you with information about caring for yourself after your procedure. Your health care provider may also give you more specific instructions. Your treatment has been planned according to current medical practices, but problems sometimes occur. Call your health care provider if you have any problems or questions after your procedure. What can I expect after the procedure? After the procedure, it is common to have:  Vomiting.  A sore throat.  Mental slowness.  It is common to feel:  Nauseous.  Cold or shivery.  Sleepy.  Tired.  Sore or achy, even in parts of your body where you did not have surgery.  Follow these instructions at home: For at least 24 hours after the procedure:  Do not: ? Participate in activities where you could fall or become injured. ? Drive. ? Use heavy machinery. ? Drink alcohol. ? Take sleeping pills or medicines that cause drowsiness. ? Make important decisions or sign legal documents. ? Take care of children on your own.  Rest. Eating and drinking  If you vomit, drink water, juice, or soup when you can drink without vomiting.  Drink enough fluid to keep your urine clear or pale yellow.  Make sure you have little or no nausea before eating solid foods.  Follow the diet recommended by your health care provider. General instructions  Have a responsible adult stay with you until you are awake and alert.  Return to your normal activities as told by your health care provider. Ask your health care provider what activities are safe for you.  Take over-the-counter and prescription medicines only as told by your health care provider.  If you smoke, do not smoke without supervision.  Keep all follow-up visits as told by your health care provider. This is important. Contact a health care provider if:  You continue to have nausea or vomiting at home, and medicines are not helpful.  You  cannot drink fluids or start eating again.  You cannot urinate after 8-12 hours.  You develop a skin rash.  You have fever.  You have increasing redness at the site of your procedure. Get help right away if:  You have difficulty breathing.  You have chest pain.  You have unexpected bleeding.  You feel that you are having a life-threatening or urgent problem. This information is not intended to replace advice given to you by your health care provider. Make sure you discuss any questions you have with your health care provider. Document Released: 02/15/2001 Document Revised: 04/13/2016 Document Reviewed: 10/24/2015 Elsevier Interactive Patient Education  2018 [place] may bear full weight on your left leg. Avoid any impact type activities.  You may remove the large bandage and shower in 2 days  Take an 81 mg Aspirin daily for the next 4 weeks starting tomorrow

## 2017-05-05 NOTE — Transfer of Care (Signed)
Immediate Anesthesia Transfer of Care Note  Patient: HLEE FRINGER  Procedure(s) Performed: Procedure(s): Left hip bursectomy; gluteal tendon repair (Left)  Patient Location: PACU  Anesthesia Type:General  Level of Consciousness: awake, alert  and oriented  Airway & Oxygen Therapy: Patient Spontanous Breathing and Patient connected to face mask oxygen  Post-op Assessment: Report given to RN and Post -op Vital signs reviewed and stable  Post vital signs: Reviewed and stable  Last Vitals:  Vitals:   05/05/17 1306 05/05/17 1615  BP: (!) 145/82 137/74  Pulse: 78 75  Resp: 16 11  Temp: 36.8 C 36.4 C    Last Pain:  Vitals:   05/05/17 1615  TempSrc:   PainSc: 6       Patients Stated Pain Goal: 3 (35/46/56 [id number])  Complications: No apparent anesthesia complications

## 2017-05-05 NOTE — Anesthesia Postprocedure Evaluation (Signed)
Anesthesia Post Note  Patient: Kimberly Hale  Procedure(s) Performed: Procedure(s) (LRB): Left hip bursectomy; gluteal tendon repair (Left)     Patient location during evaluation: PACU Anesthesia Type: General Level of consciousness: awake and alert Pain management: pain level controlled Vital Signs Assessment: post-procedure vital signs reviewed and stable Respiratory status: spontaneous breathing, nonlabored ventilation and respiratory function stable Cardiovascular status: blood pressure returned to baseline and stable Postop Assessment: no signs of nausea or vomiting Anesthetic complications: no    Last Vitals:  Vitals:   05/05/17 1730 05/05/17 1742  BP: 121/75 120/65  Pulse: 66 64  Resp: 17 16  Temp:  36.9 C    Last Pain:  Vitals:   05/05/17 1730  TempSrc:   PainSc: [name]

## 2017-05-05 NOTE — Anesthesia Preprocedure Evaluation (Addendum)
Anesthesia Evaluation  Patient identified by MRN, date of birth, ID band Patient awake    Reviewed: Allergy & Precautions, NPO status , Patient's Chart, lab work & pertinent test results  Airway Mallampati: II  TM Distance: >3 FB Neck ROM: Full    Dental no notable dental hx.    Pulmonary neg pulmonary ROS, former smoker,    Pulmonary exam normal breath sounds clear to auscultation       Cardiovascular negative cardio ROS Normal cardiovascular exam Rhythm:Regular Rate:Normal     Neuro/Psych  Headaches, Anxiety negative neurological ROS  negative psych ROS   GI/Hepatic negative GI ROS, Neg liver ROS, GERD  ,  Endo/Other  negative endocrine ROS  Renal/GU negative Renal ROS  negative genitourinary   Musculoskeletal negative musculoskeletal ROS (+)   Abdominal   Peds negative pediatric ROS (+)  Hematology negative hematology ROS (+)   Anesthesia Other Findings   Reproductive/Obstetrics negative OB ROS                             Anesthesia Physical Anesthesia Plan  ASA: II  Anesthesia Plan: General   Post-op Pain Management:    Induction: Intravenous  PONV Risk Score and Plan: 4 or greater and Ondansetron, Dexamethasone, Propofol, Midazolam and Treatment may vary due to age or medical condition  Airway Management Planned: Oral ETT  Additional Equipment:   Intra-op Plan:   Post-operative Plan: Extubation in OR  Informed Consent: I have reviewed the patients History and Physical, chart, labs and discussed the procedure including the risks, benefits and alternatives for the proposed anesthesia with the patient or authorized representative who has indicated his/her understanding and acceptance.   Dental advisory given  Plan Discussed with: CRNA  Anesthesia Plan Comments:         Anesthesia Quick Evaluation

## 2017-05-05 NOTE — Brief Op Note (Signed)
05/05/2017  3:50 PM  PATIENT:  Kimberly Hale  61 y.o. female  PRE-OPERATIVE DIAGNOSIS:  Left hip intractable bursitis; gluteal tendon tear   POST-OPERATIVE DIAGNOSIS:  Left hip intractable bursitis; gluteal tendon tear  PROCEDURE:  Procedure(s): Left hip bursectomy; gluteal tendon repair (Left)  SURGEON:  Surgeon(s) and Role:    Gaynelle Arabian, MD - Primary  PHYSICIAN ASSISTANT:   ASSISTANTS: Arlee Muslim, PA-C   ANESTHESIA:   general  EBL:  No intake/output data recorded.  BLOOD ADMINISTERED:none  DRAINS: none   LOCAL MEDICATIONS USED:  OTHER Exparel  SPECIMEN:  No Specimen  COUNTS:  YES  TOURNIQUET:  * No tourniquets in log *  DICTATION: .Other Dictation: Dictation Number [id number]  PLAN OF CARE: Discharge to home after PACU  PATIENT DISPOSITION:  PACU - hemodynamically stable.

## 2017-05-05 NOTE — Anesthesia Procedure Notes (Signed)
Procedure Name: Intubation Date/Time: 05/05/2017 3:03 PM Performed by: Glory Buff Pre-anesthesia Checklist: Patient identified, Emergency Drugs available, Suction available and Patient being monitored Patient Re-evaluated:Patient Re-evaluated prior to inductionOxygen Delivery Method: Circle system utilized Preoxygenation: Pre-oxygenation with 100% oxygen Intubation Type: IV induction Ventilation: Mask ventilation without difficulty Laryngoscope Size: Miller and 3 Grade View: Grade I Tube type: Oral Tube size: 7.0 mm Number of attempts: 1 Airway Equipment and Method: Stylet and Oral airway Placement Confirmation: ETT inserted through vocal cords under direct vision,  positive ETCO2 and breath sounds checked- equal and bilateral Secured at: 20 cm Tube secured with: Tape Dental Injury: Teeth and Oropharynx as per pre-operative assessment

## 2017-05-05 NOTE — Op Note (Signed)
Kimberly Hale, Kimberly Hale NO.:  [id number]  MEDICAL RECORD NO.:  [id number]  LOCATION:  [facility]                         FACILITY:  [facility]  PHYSICIAN:  Gaynelle Arabian, M.D.    DATE OF BIRTH:  02-06-1956  DATE OF PROCEDURE:  05/05/2017 DATE OF DISCHARGE:                              OPERATIVE REPORT   PREOPERATIVE DIAGNOSIS:  Left hip intractable bursitis with gluteal tendon tear.  POSTOPERATIVE DIAGNOSIS:  Left hip intractable bursitis with gluteal tendon tear.  PROCEDURE:  Left hip bursectomy with gluteal tendon repair.  SURGEON:  Gaynelle Arabian, M.D.  ASSISTANT:  Alexzandrew L. Perkins, P.A.C.  ANESTHESIA:  General.  ESTIMATED BLOOD LOSS:  Minimal.  DRAINS:  None.  COMPLICATIONS:  None.  CONDITION:  Stable to recovery.  BRIEF CLINICAL NOTE:  Ms. Oldfield is a 61 year old female, with long history of persistent left lateral hip pain.  She has had nonoperative management including injection therapy and has not gotten better at all. Recent MRI showed a tear of the gluteus medius and minimus tendons.  She presents now for operative repair.  DESCRIPTION OF PROCEDURE:  After successful administration of general anesthetic, the patient was placed in the right lateral decubitus position with a left side up and held with the hip positioner.  Left lower extremity was isolated from her perineum with plastic drapes and prepped and draped in the usual sterile fashion.  The incision was then drawn and centered over the tip of the greater trochanter coursing about 2-3 cm above that point 2-3 cm below.  Skin was cut with a 10 blade through subcutaneous tissue and extends to the fascia lata.  The fascia lata was incised with a fresh blade.  There was some scattered bursal tissue, but it was not real thickened or fluid-filled.  The bursal tissue was removed.  There was a palpable defect present in the central aspect of the gluteus medius about 5-7 mm in the diameter of  the tear and it barely retracted.  We  went between the fibers in the medius to identify the tear.  We debrided the edge of this, and placed a Mitek anchor, and passed it through the torn tendon to reattach it back to the greater trochanter.  The hip was ranged and the repair was stable.  We then copiously irrigated with saline solution and then injected a total of 20 mL of Exparel mixed with 30 mL of saline into the fascia and the subcutaneous tissue.  We also injected an another 20 mL of 0.25% Marcaine into the same tissues.  We then closed the fascia lata with a running #1 V-Loc suture and cut out a small triangle of tissue at the tip of the trochanter so that the tendon would not rub over the trochanter would not recreate a bursa. The subcu was then closed with interrupted 2-0 Vicryl and subcuticular running 4-0 Monocryl.  The incision was then cleaned and dried and Steri- Strips and a bulky sterile dressing were applied.  She was then awakened and taken to recovery in stable condition.     Gaynelle Arabian, M.D.     FA/MEDQ  D:  05/05/2017  T:  05/05/2017  Job:  [id number]

## 2017-05-06 ENCOUNTER — Encounter (HOSPITAL_COMMUNITY): Payer: Self-pay | Admitting: Orthopedic Surgery

## 2017-05-15 IMAGING — CT CT RENAL STONE PROTOCOL
2 of 4 series · 16 of 46 positions shown, 18 images · non-contrast
Comparison: None.

CLINICAL DATA: Right flank pain and nausea since this morning.

EXAM:
CT ABDOMEN AND PELVIS WITHOUT CONTRAST
TECHNIQUE: Multidetector CT imaging of the abdomen and pelvis was performed
following the standard protocol without IV contrast.

[Series 2: axial st · axial · 0.81mm/px · z∈[-550,-150]mm · 13 of 88 slices shown, 15 images]
[im 4/88  soft-tissue]
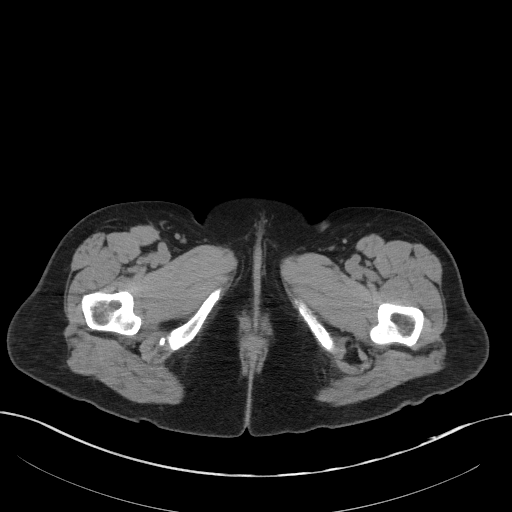
[im 4/88  bone]
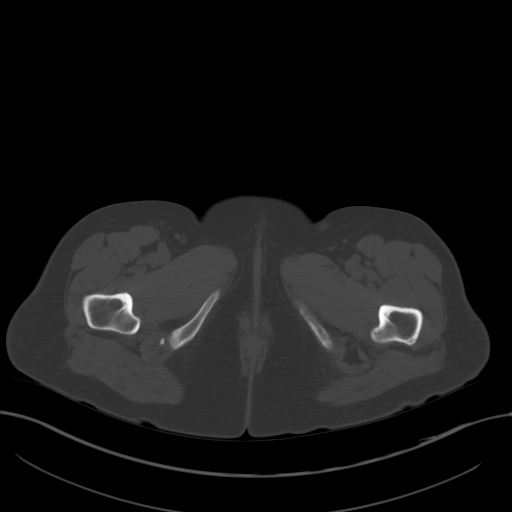
[im 11/88  soft-tissue]
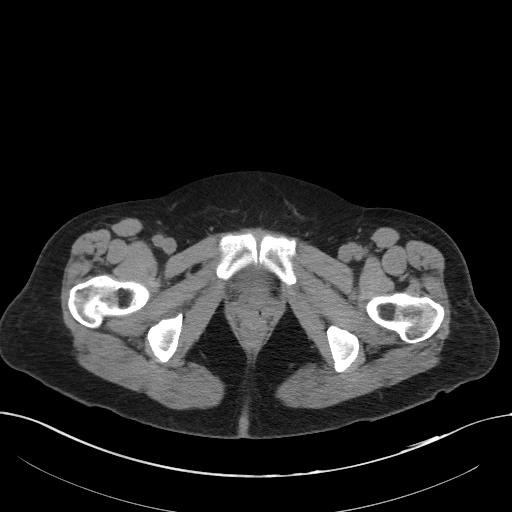
[im 17/88  soft-tissue]
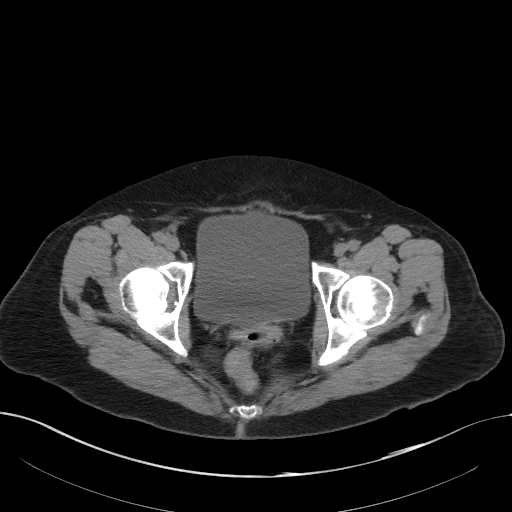
[im 24/88  soft-tissue]
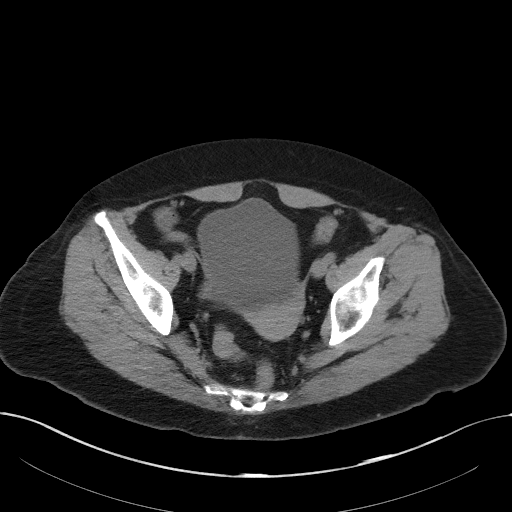
[im 31/88  soft-tissue]
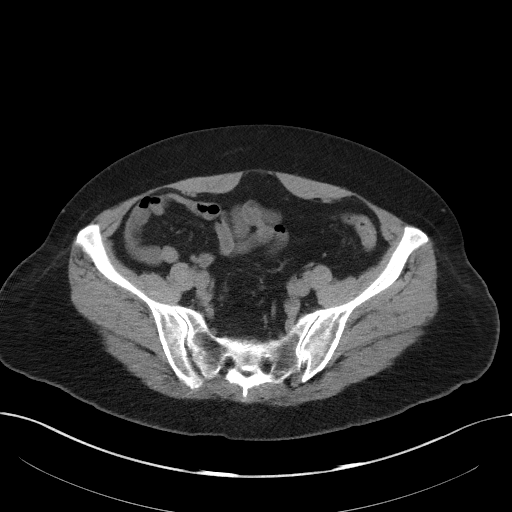
[im 37/88  soft-tissue]
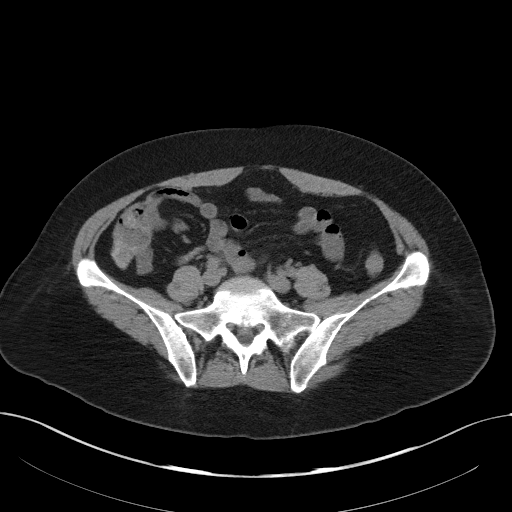
[im 44/88  soft-tissue]
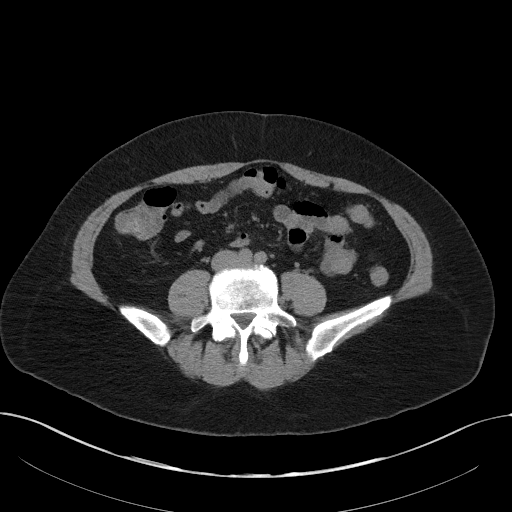
[im 51/88  soft-tissue]
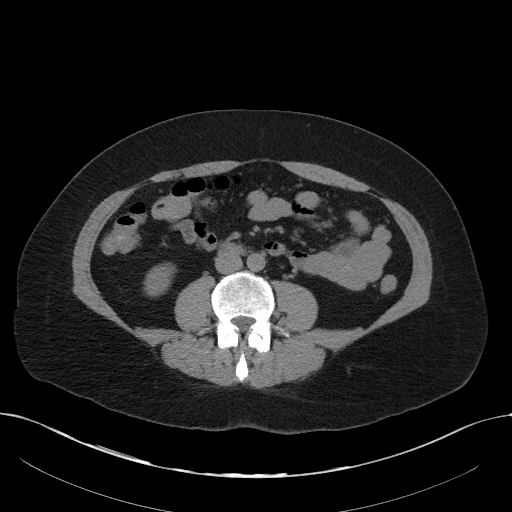
[im 57/88  soft-tissue]
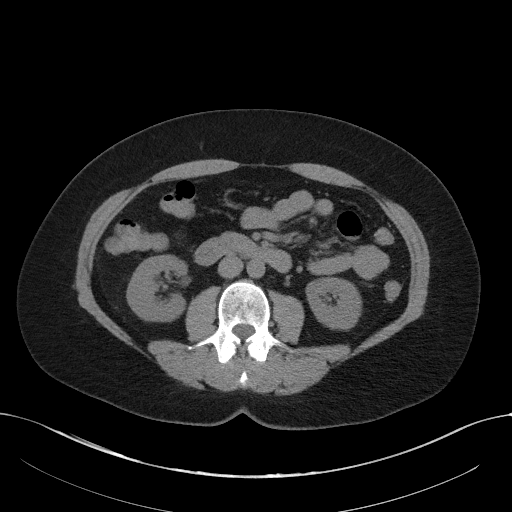
[im 57/88  bone]
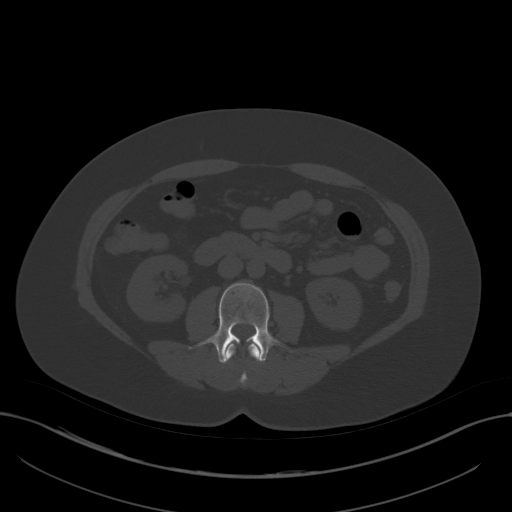
[im 64/88  soft-tissue]
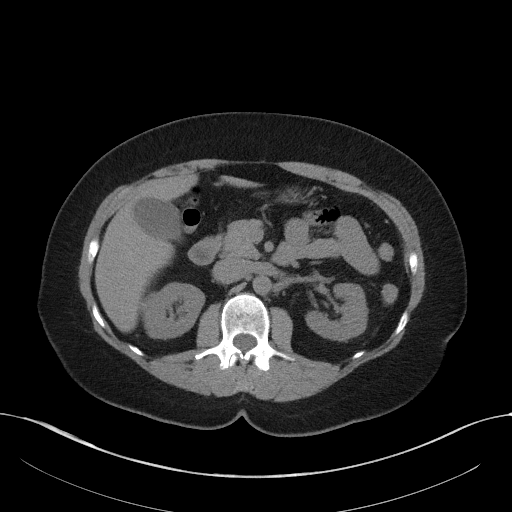
[im 71/88  soft-tissue]
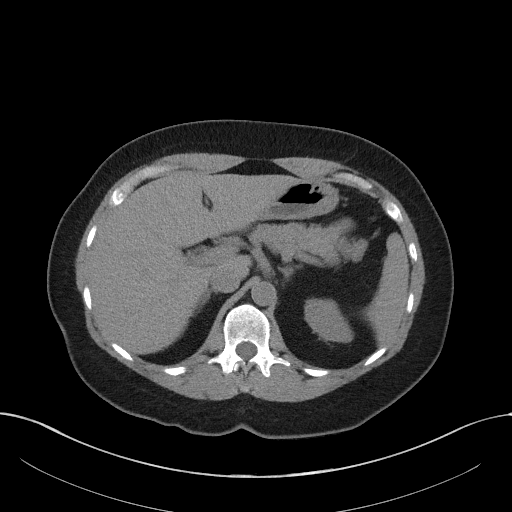
[im 77/88  soft-tissue]
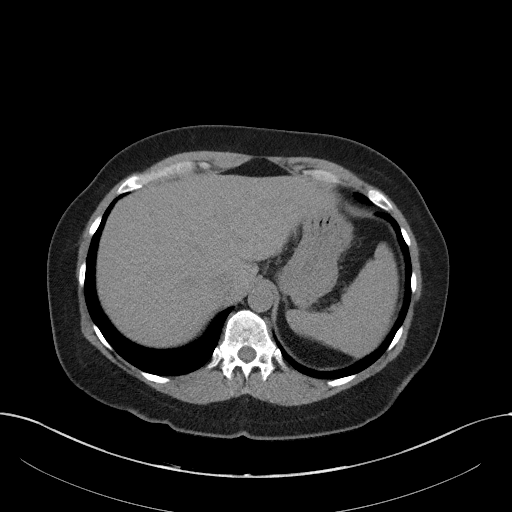
[im 84/88  soft-tissue]
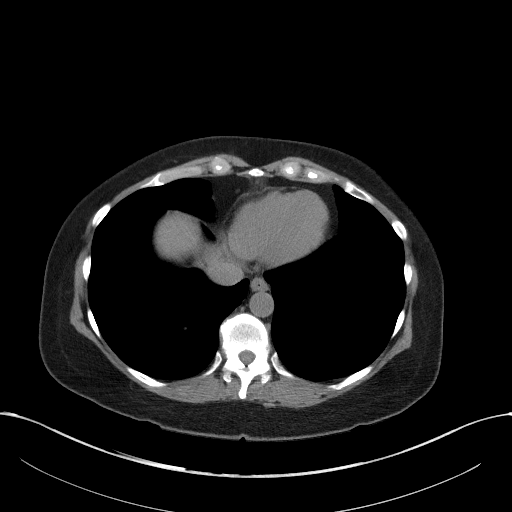

[Series 5: coronal st · coronal · 0.72mm/px · 3 of 92 slices shown]
[im 31/92  soft-tissue]
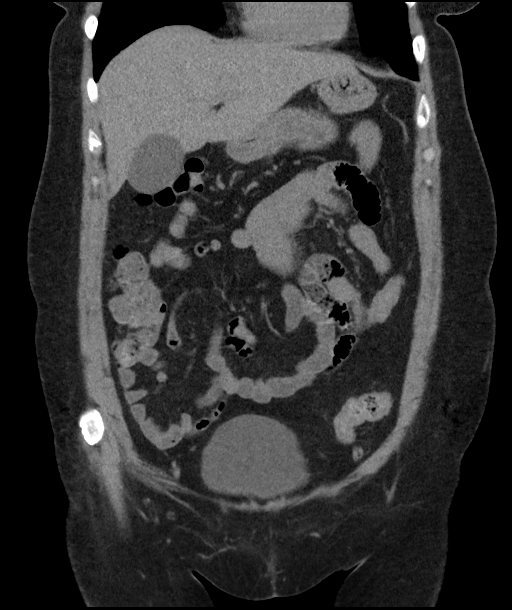
[im 41/92  soft-tissue]
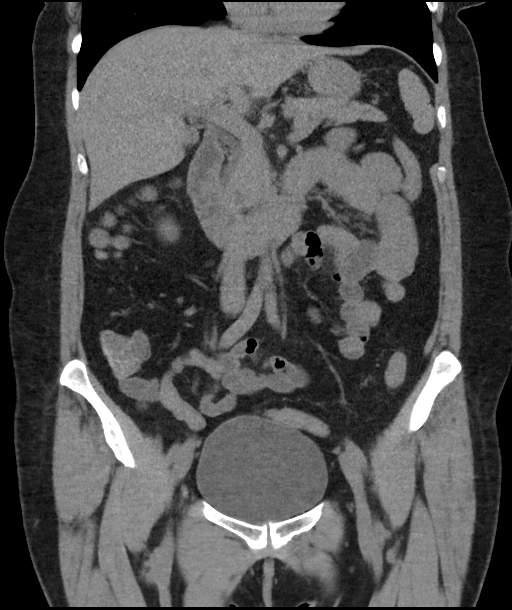
[im 51/92  soft-tissue]
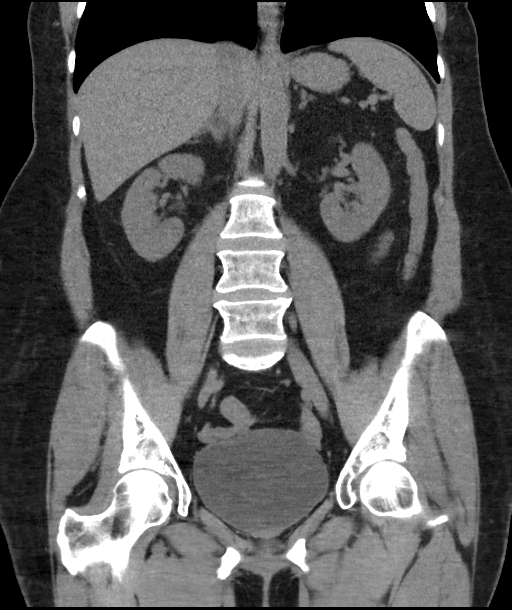

[16 of 46 positions shown; findings below may reference images not displayed]

FINDINGS: Lower chest: Mild linear atelectasis or scarring at the left lung
base.

Hepatobiliary: Tiny gallstone in the gallbladder, measuring 2 mm in
maximum diameter. There is an additional smaller cholesterol stone
in the gallbladder. No gallbladder wall thickening or
pericholecystic fluid. Unremarkable liver.

Pancreas: Unremarkable. No pancreatic ductal dilatation or
surrounding inflammatory changes.

Spleen: Normal in size without focal abnormality.

Adrenals/Urinary Tract: Adrenal glands are unremarkable. Kidneys are
normal, without renal calculi, focal lesion, or hydronephrosis.
Bladder is unremarkable.

Stomach/Bowel: Stomach is within normal limits. Appendix appears
normal. No evidence of bowel wall thickening, distention, or
inflammatory changes.

Vascular/Lymphatic: Minimal aortic calcification. No enlarged lymph
nodes.

Reproductive: Uterus and bilateral adnexa are unremarkable.

Other: Tiny umbilical hernia containing fat.

Musculoskeletal: Mild lumbar lower thoracic spine degenerative
changes. Small left femoral head and left sacral bone islands.
IMPRESSION: 1. No acute abnormality.
2. Cholelithiasis without evidence of cholecystitis.
3. Minimal aortic atherosclerosis.

## 2017-05-20 DIAGNOSIS — M25652 Stiffness of left hip, not elsewhere classified: Secondary | ICD-10-CM | POA: Diagnosis not present

## 2017-05-31 ENCOUNTER — Encounter: Payer: Self-pay | Admitting: Women's Health

## 2017-05-31 DIAGNOSIS — Z1231 Encounter for screening mammogram for malignant neoplasm of breast: Secondary | ICD-10-CM | POA: Diagnosis not present

## 2017-06-04 DIAGNOSIS — M7062 Trochanteric bursitis, left hip: Secondary | ICD-10-CM | POA: Diagnosis not present

## 2017-10-18 ENCOUNTER — Ambulatory Visit (INDEPENDENT_AMBULATORY_CARE_PROVIDER_SITE_OTHER): Payer: Commercial Managed Care - PPO | Admitting: Women's Health

## 2017-10-18 ENCOUNTER — Encounter: Payer: Self-pay | Admitting: Women's Health

## 2017-10-18 VITALS — BP 138/80 | Ht 64.0 in | Wt 163.0 lb

## 2017-10-18 DIAGNOSIS — Z13820 Encounter for screening for osteoporosis: Secondary | ICD-10-CM

## 2017-10-18 DIAGNOSIS — Z01419 Encounter for gynecological examination (general) (routine) without abnormal findings: Secondary | ICD-10-CM

## 2017-10-18 DIAGNOSIS — Z13220 Encounter for screening for lipoid disorders: Secondary | ICD-10-CM

## 2017-10-18 NOTE — Progress Notes (Signed)
Kimberly Hale 23-Jan-1956 [id number]    History:    Presents for annual exam.  Postmenopausal on no HRT with no bleeding. Not sexually active/husbands health prostate cancer, heart disease. Normal Pap and mammogram history. 10/2015 T score -2.4 FRAX 21%/2.2% stable. Primary care manages anxiety/depression meds. 04/2017 left hip bursectomy. HSV no outbreaks. Has had Zostavax.  Past medical history, past surgical history, family history and social history were all reviewed and documented in the EPIC chart. 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  ROS:  A ROS was performed and pertinent positives and negatives are included.  Exam:  Vitals:   10/18/17 1605  BP: 138/80  Weight: 163 lb (73.9 kg)  Height: 5\' 4"  (1.626 m)   Body mass index is 27.98 kg/m.   General appearance:  Normal Thyroid:  Symmetrical, normal in size, without palpable masses or nodularity. Respiratory  Auscultation:  Clear without wheezing or rhonchi Cardiovascular  Auscultation:  Regular rate, without rubs, murmurs or gallops  Edema/varicosities:  Not grossly evident Abdominal  Soft,nontender, without masses, guarding or rebound.  Liver/spleen:  No organomegaly noted  Hernia:  None appreciated  Skin  Inspection:  Grossly normal   Breasts: Examined lying and sitting.     Right: Without masses, retractions, discharge or axillary adenopathy.     Left: Without masses, retractions, discharge or axillary adenopathy. Gentitourinary   Inguinal/mons:  Normal without inguinal adenopathy  External genitalia:  Normal  BUS/Urethra/Skene's glands:  Normal  Vagina:  Normal  Cervix:  Normal  Uterus: normal in size, shape and contour.  Midline and mobile  Adnexa/parametria:     Rt: Without masses or tenderness.   Lt: Without masses or tenderness.  Anus and perineum: Normal  Digital rectal exam: Normal sphincter tone without palpated masses or  tenderness  Assessment/Plan:  61 y.o. MWF G2 P1 for annual exam with no complaints.  Postmenopausal/no HRT/no bleeding Osteopenia without elevated FRAX Anxiety/depression-primary care manages labs and meds HSV no outbreaks  Plan: Denies need for Valtrex refill. SBE's, continue annual screening mammogram, calcium rich diet, vitamin D 1000 daily encouraged. Repeat DEXA, home safety, fall prevention and importance of weightbearing/balance exercises reviewed. Pap normal 2016, new screening guidelines reviewed. CBC, CMP, lipid panel,    Huel Cote [facility], 4:52 PM 10/18/2017

## 2017-10-18 NOTE — Patient Instructions (Signed)
Health Maintenance for Postmenopausal Women Menopause is a normal process in which your reproductive ability comes to an end. This process happens gradually over a span of months to years, usually between the ages of 22 and 9. Menopause is complete when you have missed 12 consecutive menstrual periods. It is important to talk with your health care provider about some of the most common conditions that affect postmenopausal women, such as heart disease, cancer, and bone loss (osteoporosis). Adopting a healthy lifestyle and getting preventive care can help to promote your health and wellness. Those actions can also lower your chances of developing some of these common conditions. What should I know about menopause? During menopause, you may experience a number of symptoms, such as:  Moderate-to-severe hot flashes.  Night sweats.  Decrease in sex drive.  Mood swings.  Headaches.  Tiredness.  Irritability.  Memory problems.  Insomnia.  Choosing to treat or not to treat menopausal changes is an individual decision that you make with your health care provider. What should I know about hormone replacement therapy and supplements? Hormone therapy products are effective for treating symptoms that are associated with menopause, such as hot flashes and night sweats. Hormone replacement carries certain risks, especially as you become older. If you are thinking about using estrogen or estrogen with progestin treatments, discuss the benefits and risks with your health care provider. What should I know about heart disease and stroke? Heart disease, heart attack, and stroke become more likely as you age. This may be due, in part, to the hormonal changes that your body experiences during menopause. These can affect how your body processes dietary fats, triglycerides, and cholesterol. Heart attack and stroke are both medical emergencies. There are many things that you can do to help prevent heart disease  and stroke:  Have your blood pressure checked at least every 1-2 years. High blood pressure causes heart disease and increases the risk of stroke.  If you are 53-22 years old, ask your health care provider if you should take aspirin to prevent a heart attack or a stroke.  Do not use any tobacco products, including cigarettes, chewing tobacco, or electronic cigarettes. If you need help quitting, ask your health care provider.  It is important to eat a healthy diet and maintain a healthy weight. ? Be sure to include plenty of vegetables, fruits, low-fat dairy products, and lean protein. ? Avoid eating foods that are high in solid fats, added sugars, or salt (sodium).  Get regular exercise. This is one of the most important things that you can do for your health. ? Try to exercise for at least 150 minutes each week. The type of exercise that you do should increase your heart rate and make you sweat. This is known as moderate-intensity exercise. ? Try to do strengthening exercises at least twice each week. Do these in addition to the moderate-intensity exercise.  Know your numbers.Ask your health care provider to check your cholesterol and your blood glucose. Continue to have your blood tested as directed by your health care provider.  What should I know about cancer screening? There are several types of cancer. Take the following steps to reduce your risk and to catch any cancer development as early as possible. Breast Cancer  Practice breast self-awareness. ? This means understanding how your breasts normally appear and feel. ? It also means doing regular breast self-exams. Let your health care provider know about any changes, no matter how small.  If you are 40  or older, have a clinician do a breast exam (clinical breast exam or CBE) every year. Depending on your age, family history, and medical history, it may be recommended that you also have a yearly breast X-ray (mammogram).  If you  have a family history of breast cancer, talk with your health care provider about genetic screening.  If you are at high risk for breast cancer, talk with your health care provider about having an MRI and a mammogram every year.  Breast cancer (BRCA) gene test is recommended for women who have family members with BRCA-related cancers. Results of the assessment will determine the need for genetic counseling and BRCA1 and for BRCA2 testing. BRCA-related cancers include these types: ? Breast. This occurs in males or females. ? Ovarian. ? Tubal. This may also be called fallopian tube cancer. ? Cancer of the abdominal or pelvic lining (peritoneal cancer). ? Prostate. ? Pancreatic.  Cervical, Uterine, and Ovarian Cancer Your health care provider may recommend that you be screened regularly for cancer of the pelvic organs. These include your ovaries, uterus, and vagina. This screening involves a pelvic exam, which includes checking for microscopic changes to the surface of your cervix (Pap test).  For women ages 21-65, health care providers may recommend a pelvic exam and a Pap test every three years. For women ages 79-65, they may recommend the Pap test and pelvic exam, combined with testing for human papilloma virus (HPV), every five years. Some types of HPV increase your risk of cervical cancer. Testing for HPV may also be done on women of any age who have unclear Pap test results.  Other health care providers may not recommend any screening for nonpregnant women who are considered low risk for pelvic cancer and have no symptoms. Ask your health care provider if a screening pelvic exam is right for you.  If you have had past treatment for cervical cancer or a condition that could lead to cancer, you need Pap tests and screening for cancer for at least 20 years after your treatment. If Pap tests have been discontinued for you, your risk factors (such as having a new sexual partner) need to be  reassessed to determine if you should start having screenings again. Some women have medical problems that increase the chance of getting cervical cancer. In these cases, your health care provider may recommend that you have screening and Pap tests more often.  If you have a family history of uterine cancer or ovarian cancer, talk with your health care provider about genetic screening.  If you have vaginal bleeding after reaching menopause, tell your health care provider.  There are currently no reliable tests available to screen for ovarian cancer.  Lung Cancer Lung cancer screening is recommended for adults 69-62 years old who are at high risk for lung cancer because of a history of smoking. A yearly low-dose CT scan of the lungs is recommended if you:  Currently smoke.  Have a history of at least 30 pack-years of smoking and you currently smoke or have quit within the past 15 years. A pack-year is smoking an average of one pack of cigarettes per day for one year.  Yearly screening should:  Continue until it has been 15 years since you quit.  Stop if you develop a health problem that would prevent you from having lung cancer treatment.  Colorectal Cancer  This type of cancer can be detected and can often be prevented.  Routine colorectal cancer screening usually begins at  age 42 and continues through age 45.  If you have risk factors for colon cancer, your health care provider may recommend that you be screened at an earlier age.  If you have a family history of colorectal cancer, talk with your health care provider about genetic screening.  Your health care provider may also recommend using home test kits to check for hidden blood in your stool.  A small camera at the end of a tube can be used to examine your colon directly (sigmoidoscopy or colonoscopy). This is done to check for the earliest forms of colorectal cancer.  Direct examination of the colon should be repeated every  5-10 years until age 71. However, if early forms of precancerous polyps or small growths are found or if you have a family history or genetic risk for colorectal cancer, you may need to be screened more often.  Skin Cancer  Check your skin from head to toe regularly.  Monitor any moles. Be sure to tell your health care provider: ? About any new moles or changes in moles, especially if there is a change in a mole's shape or color. ? If you have a mole that is larger than the size of a pencil eraser.  If any of your family members has a history of skin cancer, especially at a young age, talk with your health care provider about genetic screening.  Always use sunscreen. Apply sunscreen liberally and repeatedly throughout the day.  Whenever you are outside, protect yourself by wearing long sleeves, pants, a wide-brimmed hat, and sunglasses.  What should I know about osteoporosis? Osteoporosis is a condition in which bone destruction happens more quickly than new bone creation. After menopause, you may be at an increased risk for osteoporosis. To help prevent osteoporosis or the bone fractures that can happen because of osteoporosis, the following is recommended:  If you are 46-71 years old, get at least 1,000 mg of calcium and at least 600 mg of vitamin D per day.  If you are older than age 55 but younger than age 65, get at least 1,200 mg of calcium and at least 600 mg of vitamin D per day.  If you are older than age 54, get at least 1,200 mg of calcium and at least 800 mg of vitamin D per day.  Smoking and excessive alcohol intake increase the risk of osteoporosis. Eat foods that are rich in calcium and vitamin D, and do weight-bearing exercises several times each week as directed by your health care provider. What should I know about how menopause affects my mental health? Depression may occur at any age, but it is more common as you become older. Common symptoms of depression  include:  Low or sad mood.  Changes in sleep patterns.  Changes in appetite or eating patterns.  Feeling an overall lack of motivation or enjoyment of activities that you previously enjoyed.  Frequent crying spells.  Talk with your health care provider if you think that you are experiencing depression. What should I know about immunizations? It is important that you get and maintain your immunizations. These include:  Tetanus, diphtheria, and pertussis (Tdap) booster vaccine.  Influenza every year before the flu season begins.  Pneumonia vaccine.  Shingles vaccine.  Your health care provider may also recommend other immunizations. This information is not intended to replace advice given to you by your health care provider. Make sure you discuss any questions you have with your health care provider. Document Released: 01/01/2006  Document Revised: 05/29/2016 Document Reviewed: 08/13/2015 Elsevier Interactive Patient Education  2018 [employer] Inc[employer]  

## 2017-10-19 ENCOUNTER — Other Ambulatory Visit: Payer: Self-pay | Admitting: Women's Health

## 2017-10-19 DIAGNOSIS — F419 Anxiety disorder, unspecified: Secondary | ICD-10-CM

## 2017-10-19 DIAGNOSIS — B009 Herpesviral infection, unspecified: Secondary | ICD-10-CM

## 2017-10-19 LAB — COMPREHENSIVE METABOLIC PANEL
AG Ratio: 1.8 (calc) (ref 1.0–2.5)
ALT: 19 U/L (ref 6–29)
AST: 18 U/L (ref 10–35)
Albumin: 4.4 g/dL (ref 3.6–5.1)
Alkaline phosphatase (APISO): 60 U/L (ref 33–130)
BUN: 8 mg/dL (ref 7–25)
CO2: 30 mmol/L (ref 20–32)
CREATININE: 0.71 mg/dL (ref 0.50–0.99)
Calcium: 9.1 mg/dL (ref 8.6–10.4)
Chloride: 100 mmol/L (ref 98–110)
GLOBULIN: 2.4 g/dL (ref 1.9–3.7)
GLUCOSE: 91 mg/dL (ref 65–99)
Potassium: 3.7 mmol/L (ref 3.5–5.3)
SODIUM: 138 mmol/L (ref 135–146)
TOTAL PROTEIN: 6.8 g/dL (ref 6.1–8.1)
Total Bilirubin: 0.4 mg/dL (ref 0.2–1.2)

## 2017-10-19 LAB — URINALYSIS W MICROSCOPIC + REFLEX CULTURE
BACTERIA UA: NONE SEEN /HPF
BILIRUBIN URINE: NEGATIVE
Glucose, UA: NEGATIVE
HYALINE CAST: NONE SEEN /LPF
Hgb urine dipstick: NEGATIVE
Ketones, ur: NEGATIVE
Leukocyte Esterase: NEGATIVE
NITRITES URINE, INITIAL: NEGATIVE
PROTEIN: NEGATIVE
SQUAMOUS EPITHELIAL / LPF: NONE SEEN /HPF (ref <=5)
Specific Gravity, Urine: 1.002 (ref 1.001–1.03)
WBC, UA: NONE SEEN /HPF (ref 0–5)
pH: 7 (ref 5.0–8.0)

## 2017-10-19 LAB — CBC WITH DIFFERENTIAL/PLATELET
BASOS ABS: 41 {cells}/uL (ref 0–200)
BASOS PCT: 0.9 %
EOS PCT: 1.1 %
Eosinophils Absolute: 51 cells/uL (ref 15–500)
HCT: 39.8 % (ref 35.0–45.0)
HEMOGLOBIN: 13.9 g/dL (ref 11.7–15.5)
Lymphs Abs: 1914 cells/uL (ref 850–3900)
MCH: 30.8 pg (ref 27.0–33.0)
MCHC: 34.9 g/dL (ref 32.0–36.0)
MCV: 88.2 fL (ref 80.0–100.0)
MONOS PCT: 8.9 %
MPV: 10.8 fL (ref 7.5–12.5)
NEUTROS ABS: 2185 {cells}/uL (ref 1500–7800)
Neutrophils Relative %: 47.5 %
Platelets: 243 10*3/uL (ref 140–400)
RBC: 4.51 10*6/uL (ref 3.80–5.10)
RDW: 12.1 % (ref 11.0–15.0)
Total Lymphocyte: 41.6 %
WBC mixed population: 409 cells/uL (ref 200–950)
WBC: 4.6 10*3/uL (ref 3.8–10.8)

## 2017-10-19 LAB — LIPID PANEL
CHOL/HDL RATIO: 2.6 (calc) (ref <5.0)
Cholesterol: 214 mg/dL — ABNORMAL HIGH (ref <200)
HDL: 81 mg/dL (ref >50)
LDL Cholesterol (Calc): 116 mg/dL (calc) — ABNORMAL HIGH
NON-HDL CHOLESTEROL (CALC): 133 mg/dL — AB (ref <130)
TRIGLYCERIDES: 80 mg/dL (ref <150)

## 2017-10-19 LAB — NO CULTURE INDICATED

## 2017-10-19 NOTE — Telephone Encounter (Signed)
Okay for refills, she was in yesterday for annual.

## 2017-10-20 NOTE — Telephone Encounter (Signed)
Xanax called in 

## 2017-10-21 ENCOUNTER — Encounter: Payer: Self-pay | Admitting: Women's Health

## 2017-10-21 NOTE — Progress Notes (Signed)
FORM FAXED [place]

## 2018-03-18 DIAGNOSIS — L821 Other seborrheic keratosis: Secondary | ICD-10-CM | POA: Diagnosis not present

## 2018-03-18 DIAGNOSIS — D225 Melanocytic nevi of trunk: Secondary | ICD-10-CM | POA: Diagnosis not present

## 2018-03-18 DIAGNOSIS — L814 Other melanin hyperpigmentation: Secondary | ICD-10-CM | POA: Diagnosis not present

## 2018-06-01 ENCOUNTER — Encounter: Payer: Self-pay | Admitting: Women's Health

## 2018-06-01 DIAGNOSIS — Z1231 Encounter for screening mammogram for malignant neoplasm of breast: Secondary | ICD-10-CM | POA: Diagnosis not present

## 2018-09-26 DIAGNOSIS — Z822 Family history of deafness and hearing loss: Secondary | ICD-10-CM | POA: Diagnosis not present

## 2018-09-26 DIAGNOSIS — H903 Sensorineural hearing loss, bilateral: Secondary | ICD-10-CM | POA: Diagnosis not present

## 2018-09-26 DIAGNOSIS — H9041 Sensorineural hearing loss, unilateral, right ear, with unrestricted hearing on the contralateral side: Secondary | ICD-10-CM | POA: Diagnosis not present

## 2018-09-26 DIAGNOSIS — H9313 Tinnitus, bilateral: Secondary | ICD-10-CM | POA: Diagnosis not present

## 2018-11-25 DIAGNOSIS — E049 Nontoxic goiter, unspecified: Secondary | ICD-10-CM | POA: Diagnosis not present

## 2018-11-25 DIAGNOSIS — M1A9XX0 Chronic gout, unspecified, without tophus (tophi): Secondary | ICD-10-CM | POA: Diagnosis not present

## 2019-01-09 ENCOUNTER — Encounter: Payer: Self-pay | Admitting: Women's Health

## 2019-01-09 ENCOUNTER — Ambulatory Visit (INDEPENDENT_AMBULATORY_CARE_PROVIDER_SITE_OTHER): Payer: Commercial Managed Care - PPO | Admitting: Women's Health

## 2019-01-09 VITALS — BP 128/80 | Ht 64.0 in | Wt 159.0 lb

## 2019-01-09 DIAGNOSIS — E559 Vitamin D deficiency, unspecified: Secondary | ICD-10-CM | POA: Diagnosis not present

## 2019-01-09 DIAGNOSIS — F419 Anxiety disorder, unspecified: Secondary | ICD-10-CM

## 2019-01-09 DIAGNOSIS — B009 Herpesviral infection, unspecified: Secondary | ICD-10-CM

## 2019-01-09 DIAGNOSIS — Z13820 Encounter for screening for osteoporosis: Secondary | ICD-10-CM | POA: Diagnosis not present

## 2019-01-09 DIAGNOSIS — Z01419 Encounter for gynecological examination (general) (routine) without abnormal findings: Secondary | ICD-10-CM | POA: Diagnosis not present

## 2019-01-09 DIAGNOSIS — Z13220 Encounter for screening for lipoid disorders: Secondary | ICD-10-CM | POA: Diagnosis not present

## 2019-01-09 MED ORDER — VALACYCLOVIR HCL 500 MG PO TABS
ORAL_TABLET | ORAL | 5 refills | Status: DC
Start: 2019-01-09 — End: 2020-03-05

## 2019-01-09 MED ORDER — ALPRAZOLAM 0.25 MG PO TABS: 0.2500 mg | ORAL_TABLET | Freq: Every evening | ORAL | 1 refills | Status: DC | PRN

## 2019-01-09 NOTE — Progress Notes (Addendum)
Kimberly Hale 12-Feb-1956 [id number]    History:    Presents for annual exam.  Postmenopausal on no HRT with no bleeding.  Not sexually active/husband's health.  Normal Pap and mammogram history.  2016 T score -2.4 FRAX 21% / 2.1%.  Has had Zostavax.  Has had 2- colonoscopies.  HSV rare outbreaks.  Anxiety occasional Xanax 1-2 times monthly.  Grovers disease.  Past medical history, past surgical history, family history and social history were all reviewed and documented in the EPIC chart.  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  Husband prostate cancer and hypertension.  ROS:  A ROS was performed and pertinent positives and negatives are included.  Exam:  Vitals:   01/09/19 1053  BP: 128/80  Weight: 159 lb (72.1 kg)  Height: 5\' 4"  (1.626 m)   Body mass index is 27.29 kg/m.   General appearance:  Normal Thyroid:  Symmetrical, normal in size, without palpable masses or nodularity. Respiratory  Auscultation:  Clear without wheezing or rhonchi Cardiovascular  Auscultation:  Regular rate, without rubs, murmurs or gallops  Edema/varicosities:  Not grossly evident Abdominal  Soft,nontender, without masses, guarding or rebound.  Liver/spleen:  No organomegaly noted  Hernia:  None appreciated  Skin  Inspection:  Grossly normal   Breasts: Examined lying and sitting.     Right: Without masses, retractions, discharge or axillary adenopathy.     Left: Without masses, retractions, discharge or axillary adenopathy. Gentitourinary   Inguinal/mons:  Normal without inguinal adenopathy  External genitalia:  Normal  BUS/Urethra/Skene's glands:  Normal  Vagina:  Normal  Cervix:  Normal  Uterus:  normal in size, shape and contour.  Midline and mobile  Adnexa/parametria:     Rt: Without masses or tenderness.   Lt: Without masses or tenderness.  Anus and perineum: Normal  Digital rectal exam: Normal sphincter tone without palpated masses or  tenderness  Assessment/Plan:  63 y.o. MWF G1, P1 for annual exam with no complaints.  Postmenopausal/no HRT/no bleeding Osteopenia without elevated FRAX HSV no outbreaks Anxiety  Plan: Valtrex 500 twice daily for 3 to 5 days as needed prescription, proper use given and reviewed.  Xanax 0.25 mg 1 as needed, aware addictive, and to use sparingly.  SBEs, continue annual 3D screening mammogram, calcium rich foods, vitamin D 2000 daily encouraged.  DEXA instructed to schedule.  Home safety, fall prevention and importance of weightbearing and balance type exercise reviewed and encouraged.  Instructed to get Pneumovax at primary care and Shingrex.  Reports is up-to-date on Tdap.  CBC, CMP, lipid panel, vitamin D, Pap with HR HPV typing, reviewed if normal will not need any further Paps.    [place], 11:42 AM 01/09/2019

## 2019-01-09 NOTE — Patient Instructions (Signed)
shingrex vaccine  pnuemonia vaccine   Health Maintenance After Age 65 After age 42, you are at a higher risk for certain long-term diseases and infections as well as injuries from falls. Falls are a major cause of broken bones and head injuries in people who are older than age 56. Getting regular preventive care can help to keep you healthy and well. Preventive care includes getting regular testing and making lifestyle changes as recommended by your health care provider. Talk with your health care provider about:  Which screenings and tests you should have. A screening is a test that checks for a disease when you have no symptoms.  A diet and exercise plan that is right for you. What should I know about screenings and tests to prevent falls? Screening and testing are the best ways to find a health problem early. Early diagnosis and treatment give you the best chance of managing medical conditions that are common after age 84. Certain conditions and lifestyle choices may make you more likely to have a fall. Your health care provider may recommend:  Regular vision checks. Poor vision and conditions such as cataracts can make you more likely to have a fall. If you wear glasses, make sure to get your prescription updated if your vision changes.  Medicine review. Work with your health care provider to regularly review all of the medicines you are taking, including over-the-counter medicines. Ask your health care provider about any side effects that may make you more likely to have a fall. Tell your health care provider if any medicines that you take make you feel dizzy or sleepy.  Osteoporosis screening. Osteoporosis is a condition that causes the bones to get weaker. This can make the bones weak and cause them to break more easily.  Blood pressure screening. Blood pressure changes and medicines to control blood pressure can make you feel dizzy.  Strength and balance checks. Your health care provider  may recommend certain tests to check your strength and balance while standing, walking, or changing positions.  Foot health exam. Foot pain and numbness, as well as not wearing proper footwear, can make you more likely to have a fall.  Depression screening. You may be more likely to have a fall if you have a fear of falling, feel emotionally low, or feel unable to do activities that you used to do.  Alcohol use screening. Using too much alcohol can affect your balance and may make you more likely to have a fall. What actions can I take to lower my risk of falls? General instructions  Talk with your health care provider about your risks for falling. Tell your health care provider if: ? You fall. Be sure to tell your health care provider about all falls, even ones that seem minor. ? You feel dizzy, sleepy, or off-balance.  Take over-the-counter and prescription medicines only as told by your health care provider. These include any supplements.  Eat a healthy diet and maintain a healthy weight. A healthy diet includes low-fat dairy products, low-fat (lean) meats, and fiber from whole grains, beans, and lots of fruits and vegetables. Home safety  Remove any tripping hazards, such as rugs, cords, and clutter.  Install safety equipment such as grab bars in bathrooms and safety rails on stairs.  Keep rooms and walkways well-lit. Activity   Follow a regular exercise program to stay fit. This will help you maintain your balance. Ask your health care provider what types of exercise are appropriate for you.  If you need a cane or walker, use it as recommended by your health care provider.  Wear supportive shoes that have nonskid soles. Lifestyle  Do not drink alcohol if your health care provider tells you not to drink.  If you drink alcohol, limit how much you have: ? 0-1 drink a day for women. ? 0-2 drinks a day for men.  Be aware of how much alcohol is in your drink. In the [place] one  drink equals one typical bottle of beer (12 oz), one-half glass of wine (5 oz), or one shot of hard liquor (1 oz).  Do not use any products that contain nicotine or tobacco, such as cigarettes and e-cigarettes. If you need help quitting, ask your health care provider. Summary  Having a healthy lifestyle and getting preventive care can help to protect your health and wellness after age 43.  Screening and testing are the best way to find a health problem early and help you avoid having a fall. Early diagnosis and treatment give you the best chance for managing medical conditions that are more common for people who are older than age 19.  Falls are a major cause of broken bones and head injuries in people who are older than age 60. Take precautions to prevent a fall at home.  Work with your health care provider to learn what changes you can make to improve your health and wellness and to prevent falls. This information is not intended to replace advice given to you by your health care provider. Make sure you discuss any questions you have with your health care provider. Document Released: 09/22/2017 Document Revised: 09/22/2017 Document Reviewed: 09/22/2017 Elsevier Interactive Patient Education  2019 [employer].

## 2019-01-09 NOTE — Addendum Note (Signed)
Addended by: Lorine Bears on: 01/09/2019 11:56 AM   Modules accepted: Orders

## 2019-01-10 LAB — CBC WITH DIFFERENTIAL/PLATELET
ABSOLUTE MONOCYTES: 477 {cells}/uL (ref 200–950)
BASOS PCT: 0.9 %
Basophils Absolute: 41 cells/uL (ref 0–200)
Eosinophils Absolute: 59 cells/uL (ref 15–500)
Eosinophils Relative: 1.3 %
HEMATOCRIT: 41.2 % (ref 35.0–45.0)
HEMOGLOBIN: 14.7 g/dL (ref 11.7–15.5)
LYMPHS ABS: 1346 {cells}/uL (ref 850–3900)
MCH: 31.5 pg (ref 27.0–33.0)
MCHC: 35.7 g/dL (ref 32.0–36.0)
MCV: 88.4 fL (ref 80.0–100.0)
MPV: 10.8 fL (ref 7.5–12.5)
Monocytes Relative: 10.6 %
NEUTROS ABS: 2579 {cells}/uL (ref 1500–7800)
NEUTROS PCT: 57.3 %
PLATELETS: 242 10*3/uL (ref 140–400)
RBC: 4.66 10*6/uL (ref 3.80–5.10)
RDW: 12.8 % (ref 11.0–15.0)
Total Lymphocyte: 29.9 %
WBC: 4.5 10*3/uL (ref 3.8–10.8)

## 2019-01-10 LAB — COMPREHENSIVE METABOLIC PANEL
AG Ratio: 1.8 (calc) (ref 1.0–2.5)
ALKALINE PHOSPHATASE (APISO): 61 U/L (ref 37–153)
ALT: 20 U/L (ref 6–29)
AST: 18 U/L (ref 10–35)
Albumin: 4.6 g/dL (ref 3.6–5.1)
BILIRUBIN TOTAL: 0.5 mg/dL (ref 0.2–1.2)
BUN: 11 mg/dL (ref 7–25)
CALCIUM: 9.6 mg/dL (ref 8.6–10.4)
CO2: 27 mmol/L (ref 20–32)
CREATININE: 0.82 mg/dL (ref 0.50–0.99)
Chloride: 102 mmol/L (ref 98–110)
Globulin: 2.6 g/dL (calc) (ref 1.9–3.7)
Glucose, Bld: 89 mg/dL (ref 65–99)
Potassium: 3.9 mmol/L (ref 3.5–5.3)
Sodium: 139 mmol/L (ref 135–146)
Total Protein: 7.2 g/dL (ref 6.1–8.1)

## 2019-01-10 LAB — LIPID PANEL
CHOLESTEROL: 214 mg/dL — AB (ref <200)
HDL: 72 mg/dL (ref >=50)
LDL CHOLESTEROL (CALC): 125 mg/dL — AB
Non-HDL Cholesterol (Calc): 142 mg/dL (calc) — ABNORMAL HIGH (ref <130)
TRIGLYCERIDES: 78 mg/dL (ref <150)
Total CHOL/HDL Ratio: 3 (calc) (ref <5.0)

## 2019-01-10 LAB — VITAMIN D 25 HYDROXY (VIT D DEFICIENCY, FRACTURES): VIT D 25 HYDROXY: 65 ng/mL (ref 30–100)

## 2019-01-11 LAB — PAP, TP IMAGING W/ HPV RNA, RFLX HPV TYPE 16,18/45: HPV DNA High Risk: NOT DETECTED

## 2019-02-13 ENCOUNTER — Encounter: Payer: Self-pay | Admitting: Women's Health

## 2019-05-24 DIAGNOSIS — M810 Age-related osteoporosis without current pathological fracture: Secondary | ICD-10-CM

## 2019-05-24 HISTORY — DX: Age-related osteoporosis without current pathological fracture: M81.0

## 2019-06-07 ENCOUNTER — Other Ambulatory Visit: Payer: Self-pay

## 2019-06-08 ENCOUNTER — Ambulatory Visit (INDEPENDENT_AMBULATORY_CARE_PROVIDER_SITE_OTHER): Payer: Commercial Managed Care - PPO

## 2019-06-08 DIAGNOSIS — M810 Age-related osteoporosis without current pathological fracture: Secondary | ICD-10-CM

## 2019-06-08 DIAGNOSIS — Z780 Asymptomatic menopausal state: Secondary | ICD-10-CM

## 2019-06-08 DIAGNOSIS — Z13820 Encounter for screening for osteoporosis: Secondary | ICD-10-CM

## 2019-06-09 ENCOUNTER — Encounter: Payer: Self-pay | Admitting: Gynecology

## 2019-06-09 ENCOUNTER — Other Ambulatory Visit: Payer: Self-pay | Admitting: Gynecology

## 2019-06-09 DIAGNOSIS — M810 Age-related osteoporosis without current pathological fracture: Secondary | ICD-10-CM

## 2019-06-09 DIAGNOSIS — Z780 Asymptomatic menopausal state: Secondary | ICD-10-CM

## 2019-06-15 ENCOUNTER — Encounter: Payer: Self-pay | Admitting: Women's Health

## 2019-07-03 ENCOUNTER — Other Ambulatory Visit: Payer: Self-pay

## 2019-07-03 DIAGNOSIS — Z20822 Contact with and (suspected) exposure to covid-19: Secondary | ICD-10-CM

## 2019-07-04 LAB — NOVEL CORONAVIRUS, NAA: SARS-CoV-2, NAA: NOT DETECTED

## 2019-07-07 ENCOUNTER — Ambulatory Visit: Payer: Commercial Managed Care - PPO | Admitting: Gynecology

## 2019-07-11 ENCOUNTER — Other Ambulatory Visit: Payer: Self-pay

## 2019-07-12 ENCOUNTER — Encounter: Payer: Self-pay | Admitting: Gynecology

## 2019-07-12 ENCOUNTER — Ambulatory Visit: Payer: Commercial Managed Care - PPO | Admitting: Gynecology

## 2019-07-12 VITALS — BP 122/74

## 2019-07-12 DIAGNOSIS — M810 Age-related osteoporosis without current pathological fracture: Secondary | ICD-10-CM | POA: Diagnosis not present

## 2019-07-12 MED ORDER — ALENDRONATE SODIUM 70 MG PO TABS: 70.0000 mg | ORAL_TABLET | ORAL | 4 refills | Status: DC

## 2019-07-13 ENCOUNTER — Encounter: Payer: Self-pay | Admitting: Gynecology

## 2019-07-13 NOTE — Patient Instructions (Signed)
Start on the alendronate as we discussed.  Follow-up if any issues.

## 2019-07-13 NOTE — Progress Notes (Signed)
    Kimberly Hale [birth year] [id number]        63 y.o.  [id number] presents to discuss her recent DEXA showing osteoporosis.  T score -2.7 left femoral neck.  -2.5 right femoral neck.  Remainder of regions of interest normal.  3+ percent loss statistically significant left hip total.  8.7% loss left neck since her prior study 2016.  Vitamin D level 65 earlier this year.  Patient active and with good calcium intake  Past medical history,surgical history, problem list, medications, allergies, family history and social history were all reviewed and documented in the EPIC chart.  Directed ROS with pertinent positives and negatives documented in the history of present illness/assessment and plan.  Exam: Vitals:   07/12/19 1606  BP: 122/74   General appearance:  Normal   Assessment/Plan:  63 y.o. [name] with osteoporosis.  We reviewed her DEXA report in detail and I provided her a copy.  We discussed osteoporosis to include her increased risk of fracture now as well as in the future.  We discussed nonpharmacologic modifications to include weightbearing exercise and adequate calcium and vitamin D intake.  We reviewed medication options to include bisphosphate's, Prolia, Evista, Evinity and Forteo.  We discussed the side effects and risks of each of these medications to include GERD, osteonecrosis of the jaw, atypical fractures, rashes, infections, thrombosis and sarcoma.  Need to switch to a different medication after 1 year with Evinity and the need to continue Prolia or again switch to a different medication with Prolia after treatment course versus continuing.  Her mother did fracture both hips in her 38s.  After lengthy discussion we both agreed for her to initiate alendronate 70 mg weekly.  Her mother took the medication she is familiar about how to take it.  I again reviewed this with her.  She will call me if she has any issues with this.  We will plan on repeating her DEXA in 2 years.  I  spent a total of 25 face-to-face minutes with the patient, over 50% was spent counseling and coordination of care.   Anastasio Auerbach MD, 7:58 AM 07/13/2019

## 2019-08-30 ENCOUNTER — Encounter: Payer: Self-pay | Admitting: Gynecology

## 2019-09-22 ENCOUNTER — Other Ambulatory Visit: Payer: Self-pay

## 2019-09-22 DIAGNOSIS — Z20822 Contact with and (suspected) exposure to covid-19: Secondary | ICD-10-CM

## 2019-09-23 LAB — NOVEL CORONAVIRUS, NAA: SARS-CoV-2, NAA: NOT DETECTED

## 2019-11-30 ENCOUNTER — Ambulatory Visit: Payer: Commercial Managed Care - PPO | Admitting: Podiatry

## 2019-11-30 ENCOUNTER — Other Ambulatory Visit: Payer: Self-pay

## 2019-11-30 ENCOUNTER — Other Ambulatory Visit: Payer: Self-pay | Admitting: Podiatry

## 2019-11-30 ENCOUNTER — Ambulatory Visit (INDEPENDENT_AMBULATORY_CARE_PROVIDER_SITE_OTHER): Payer: Commercial Managed Care - PPO

## 2019-11-30 DIAGNOSIS — M779 Enthesopathy, unspecified: Secondary | ICD-10-CM

## 2019-11-30 DIAGNOSIS — M2042 Other hammer toe(s) (acquired), left foot: Secondary | ICD-10-CM

## 2019-11-30 DIAGNOSIS — S96912A Strain of unspecified muscle and tendon at ankle and foot level, left foot, initial encounter: Secondary | ICD-10-CM | POA: Diagnosis not present

## 2019-11-30 DIAGNOSIS — S93402A Sprain of unspecified ligament of left ankle, initial encounter: Secondary | ICD-10-CM

## 2019-11-30 DIAGNOSIS — M79672 Pain in left foot: Secondary | ICD-10-CM

## 2019-11-30 DIAGNOSIS — M7751 Other enthesopathy of right foot: Secondary | ICD-10-CM

## 2019-11-30 DIAGNOSIS — R600 Localized edema: Secondary | ICD-10-CM

## 2019-11-30 DIAGNOSIS — M7750 Other enthesopathy of unspecified foot: Secondary | ICD-10-CM

## 2019-11-30 DIAGNOSIS — M2031 Hallux varus (acquired), right foot: Secondary | ICD-10-CM

## 2019-11-30 DIAGNOSIS — M21961 Unspecified acquired deformity of right lower leg: Secondary | ICD-10-CM

## 2019-11-30 DIAGNOSIS — M2041 Other hammer toe(s) (acquired), right foot: Secondary | ICD-10-CM

## 2019-11-30 NOTE — Progress Notes (Signed)
  Subjective:  Patient ID: Kimberly Hale, female    DOB: [birth year],  MRN: [id number]  Chief Complaint  Patient presents with  . Foot Pain    pt is here for a rolling of the left ankle, as well as left foot pain, due to dropping something on it, pain is elevated when applying pressure left ankle pain has been going on for about a couple of months, and the foot pain has been going on since last month    64 y.o. female presents with the above complaint. History confirmed with patient.  Objective:  Physical Exam: warm, good capillary refill, no trophic changes or ulcerative lesions, normal DP and PT pulses and normal sensory exam. Left Foot: Tenderness at the fourth and fifth metatarsals, most tender at the fifth metatarsal base.  No tenderness at the lateral malleolus or at the peroneal tendons posterior to the lateral malleolus.  Tenderness over the dorsal tarsometatarsal joints without pain with dorsal translation stress  Right Foot: tenderness of the 2nd metatarsal head, hallux varus, hammertoes noted  No images are attached to the encounter.  Radiographs: X-ray of the left foot: no fracture, dislocation, swelling or degenerative changes noted   Assessment:   1. Sprain and strain of left ankle   2. Tendonitis of ankle or foot   3. Capsulitis of metatarsophalangeal (MTP) joint of right foot   4. Localized edema   5. Acquired hallux varus, right   6. Hammertoes of both feet   7. Metatarsal deformity, right    Plan:  Patient was evaluated and treated and all questions answered.  Left foot injury secondary to sprain -No fractures noted on x-ray -She does appear to have some tendon injury of the peroneus brevis affecting the fifth ray.  We will immobilized in a cam boot and follow-up in 1 month for possible new x-rays if pain persists  Capsulitis -Educated on etiology -Injection delivered to the painful joint  Procedure: Joint Injection Location: Right 2nd MPJ  joint Skin Prep: Betadine. Injectate: 0.5 cc 1% lidocaine plain, 0.5 cc dexamethasone phosphate. Disposition: Patient tolerated procedure well. Injection site dressed with a band-aid.  Return in about 1 month (around 12/31/2019) for Capsulitis, Tendonitis.   MDM Number of Diagnoses or Management Options Acquired hallux varus, right: new, needed workup Capsulitis of metatarsophalangeal (MTP) joint of right foot: new, needed workup Hammertoes of both feet: new, needed workup Localized edema: new, needed workup Metatarsal deformity, right: new, needed workup Sprain and strain of left ankle: new, needed workup Tendonitis of ankle or foot: new, needed workup   Amount and/or Complexity of Data Reviewed Tests in the radiology section of CPT: ordered and reviewed  Risk of Complications, Morbidity, and/or Mortality Presenting problems: low Management options: low

## 2020-01-03 ENCOUNTER — Ambulatory Visit: Payer: Commercial Managed Care - PPO | Admitting: Podiatry

## 2020-01-04 ENCOUNTER — Ambulatory Visit: Payer: Commercial Managed Care - PPO | Admitting: Podiatry

## 2020-01-04 ENCOUNTER — Other Ambulatory Visit: Payer: Self-pay

## 2020-01-04 DIAGNOSIS — M7751 Other enthesopathy of right foot: Secondary | ICD-10-CM | POA: Diagnosis not present

## 2020-01-04 DIAGNOSIS — M7750 Other enthesopathy of unspecified foot: Secondary | ICD-10-CM

## 2020-01-25 ENCOUNTER — Other Ambulatory Visit: Payer: Self-pay

## 2020-01-25 ENCOUNTER — Ambulatory Visit (INDEPENDENT_AMBULATORY_CARE_PROVIDER_SITE_OTHER): Payer: Commercial Managed Care - PPO | Admitting: Podiatry

## 2020-01-25 VITALS — Temp 97.4°F

## 2020-01-25 DIAGNOSIS — M7751 Other enthesopathy of right foot: Secondary | ICD-10-CM

## 2020-01-25 DIAGNOSIS — M7750 Other enthesopathy of unspecified foot: Secondary | ICD-10-CM | POA: Diagnosis not present

## 2020-01-25 NOTE — Progress Notes (Signed)
  Subjective:  Patient ID: Kimberly Hale, female    DOB: [birth year],  MRN: [id number]  Chief Complaint  Patient presents with  . Follow-up    L ankle pain. Pt stated, "Pain has improved - intermittent, 1/10. I've been out of a boot for 3 weeks. Using ice".   64 y.o. female presents with the above complaint. History confirmed with patient.  Objective:  Physical Exam: warm, good capillary refill, no trophic changes or ulcerative lesions, normal DP and PT pulses and normal sensory exam. Left Foot: tenderness along the peroneals  Right Foot: tenderness of the 2nd metatarsal head, hallux varus, hammertoes noted  Assessment:   1. Tendonitis of ankle or foot    Plan:  Patient was evaluated and treated and all questions answered.  Capsulitis -Still with pain but no injection today  Right Ankle PT tendonitis -RxTrilock brace   Return in about 6 weeks (around 03/07/2020) for Tendonitis right.

## 2020-01-25 NOTE — Progress Notes (Signed)
  Subjective:  Patient ID: Kimberly Hale, female    DOB: [birth year],  MRN: [id number]  Chief Complaint  Patient presents with  . Follow-up    Sprain/strain - L ankle. Pt stated, "It was feeling better, but the pain has come back in the past week. 3/10 most of the time. I wear the boot on weekdays, but not on the weekends when I'm at home".    64 y.o. female presents with the above complaint. History confirmed with patient.  Objective:  Physical Exam: warm, good capillary refill, no trophic changes or ulcerative lesions, normal DP and PT pulses and normal sensory exam. Left Foot: tenderness along the peroneals  Right Foot: tenderness of the 2nd metatarsal head, hallux varus, hammertoes noted  No images are attached to the encounter.  Radiographs: X-ray of the left foot: no fracture, dislocation, swelling or degenerative changes noted   Assessment:   No diagnosis found. Plan:  Patient was evaluated and treated and all questions answered.  Left foot injury secondary to sprain -Improving, transition out of ankle brace as tolerated.  Capsulitis -Repeat injection as below  Procedure: Joint Injection Location: right 2nd MP joint Skin Prep: Alcohol. Injectate: 0.5 cc 1% lidocaine plain, 0.5 cc dexamethasone phosphate. Disposition: Patient tolerated procedure well. Injection site dressed with a band-aid.   No follow-ups on file.

## 2020-03-04 ENCOUNTER — Other Ambulatory Visit: Payer: Self-pay

## 2020-03-05 ENCOUNTER — Encounter: Payer: Self-pay | Admitting: Women's Health

## 2020-03-05 ENCOUNTER — Ambulatory Visit (INDEPENDENT_AMBULATORY_CARE_PROVIDER_SITE_OTHER): Payer: Commercial Managed Care - PPO | Admitting: Women's Health

## 2020-03-05 VITALS — BP 124/80 | Ht 64.0 in | Wt 163.0 lb

## 2020-03-05 DIAGNOSIS — Z13220 Encounter for screening for lipoid disorders: Secondary | ICD-10-CM

## 2020-03-05 DIAGNOSIS — B009 Herpesviral infection, unspecified: Secondary | ICD-10-CM

## 2020-03-05 DIAGNOSIS — F419 Anxiety disorder, unspecified: Secondary | ICD-10-CM | POA: Diagnosis not present

## 2020-03-05 DIAGNOSIS — Z01419 Encounter for gynecological examination (general) (routine) without abnormal findings: Secondary | ICD-10-CM

## 2020-03-05 MED ORDER — VALACYCLOVIR HCL 500 MG PO TABS: ORAL_TABLET | ORAL | 5 refills | Status: DC

## 2020-03-05 MED ORDER — ALENDRONATE SODIUM 70 MG PO TABS: 70.0000 mg | ORAL_TABLET | ORAL | 4 refills | Status: DC

## 2020-03-05 MED ORDER — ALPRAZOLAM 0.25 MG PO TABS: 0.2500 mg | ORAL_TABLET | Freq: Every evening | ORAL | 1 refills | Status: DC | PRN

## 2020-03-05 NOTE — Progress Notes (Signed)
Kimberly Hale 29-Oct-1956 [id number]    History:    Presents for annual exam.  Postmenopausal on no HRT with no bleeding.  Normal Pap and mammogram history.  05/2019 T score -2.7 Fosamax started and is tolerating well.  2017 normal colonoscopy at [facility]  Has had Zostavax and Covid vaccine.  Not sexually active husband's health.  Past medical history, past surgical history, family history and social history were all reviewed and documented in the EPIC chart.  Works in [employer].  Has been having numerous health problems history of prostate cancer, hypertension and has been diagnosed with Parkinson having trouble with balance had a traumatic fall with broken back last fall.  1 son who is helpful.  ROS:  A ROS was performed and pertinent positives and negatives are included.  Exam:  Vitals:   03/05/20 1603  BP: 124/80  Weight: 163 lb (73.9 kg)  Height: 5\' 4"  (1.626 m)   Body mass index is 27.98 kg/m.   General appearance:  Normal Thyroid:  Symmetrical, normal in size, without palpable masses or nodularity. Respiratory  Auscultation:  Clear without wheezing or rhonchi Cardiovascular  Auscultation:  Regular rate, without rubs, murmurs or gallops  Edema/varicosities:  Not grossly evident Abdominal  Soft,nontender, without masses, guarding or rebound.  Liver/spleen:  No organomegaly noted  Hernia:  None appreciated  Skin  Inspection:  Grossly normal   Breasts: Examined lying and sitting.     Right: Without masses, retractions, discharge or axillary adenopathy.     Left: Without masses, retractions, discharge or axillary adenopathy. Gentitourinary   Inguinal/mons:  Normal without inguinal adenopathy  External genitalia:  Normal  BUS/Urethra/Skene's glands:  Normal  Vagina:  Normal  Cervix:  Normal  Uterus:  normal in size, shape and contour.  Midline and mobile  Adnexa/parametria:     Rt: Without masses or tenderness.   Lt: Without masses or  tenderness.  Anus and perineum: Normal  Digital rectal exam: Normal sphincter tone without palpated masses or tenderness  Assessment/Plan:  64 y.o. MWF G3, P1 for annual exam with no complaints of vaginal discharge, urinary symptoms, abdominal pain or fever.  Postmenopausal no HRT with no bleeding 05/2019 osteoporosis started Fosamax 05/2019 tolerating well Situational stress husband's health  Plan: Fosamax 70 mg p.o. weekly prescription, proper use/administration reviewed repeat DEXA in 1 year.  Reviewed importance of weightbearing and balance type exercise yoga encouraged.  Home safety, fall prevention discussed.  Self-care, leisure activities encouraged.  Pneumonia vaccines reviewed one this year repeat in 1 year can get a primary care.  Requested labs for health screening at work, CBC, CMP, lipid panel, not fasting.  Pap normal 2020, new screening guidelines reviewed.    [place], 4:42 PM 03/05/2020

## 2020-03-05 NOTE — Patient Instructions (Signed)
Shingrix vaccine series of 2 shots to take at least 6 months after Covid can get at pharmacy Pneumonia vaccine when this year, repeat in 1 year can get at primary care Has been a pleasure knowing you Health Maintenance After Age 66 After age 11, you are at a higher risk for certain long-term diseases and infections as well as injuries from falls. Falls are a major cause of broken bones and head injuries in people who are older than age 33. Getting regular preventive care can help to keep you healthy and well. Preventive care includes getting regular testing and making lifestyle changes as recommended by your health care provider. Talk with your health care provider about:  Which screenings and tests you should have. A screening is a test that checks for a disease when you have no symptoms.  A diet and exercise plan that is right for you. What should I know about screenings and tests to prevent falls? Screening and testing are the best ways to find a health problem early. Early diagnosis and treatment give you the best chance of managing medical conditions that are common after age 72. Certain conditions and lifestyle choices may make you more likely to have a fall. Your health care provider may recommend:  Regular vision checks. Poor vision and conditions such as cataracts can make you more likely to have a fall. If you wear glasses, make sure to get your prescription updated if your vision changes.  Medicine review. Work with your health care provider to regularly review all of the medicines you are taking, including over-the-counter medicines. Ask your health care provider about any side effects that may make you more likely to have a fall. Tell your health care provider if any medicines that you take make you feel dizzy or sleepy.  Osteoporosis screening. Osteoporosis is a condition that causes the bones to get weaker. This can make the bones weak and cause them to break more easily.  Blood  pressure screening. Blood pressure changes and medicines to control blood pressure can make you feel dizzy.  Strength and balance checks. Your health care provider may recommend certain tests to check your strength and balance while standing, walking, or changing positions.  Foot health exam. Foot pain and numbness, as well as not wearing proper footwear, can make you more likely to have a fall.  Depression screening. You may be more likely to have a fall if you have a fear of falling, feel emotionally low, or feel unable to do activities that you used to do.  Alcohol use screening. Using too much alcohol can affect your balance and may make you more likely to have a fall. What actions can I take to lower my risk of falls? General instructions  Talk with your health care provider about your risks for falling. Tell your health care provider if: ? You fall. Be sure to tell your health care provider about all falls, even ones that seem minor. ? You feel dizzy, sleepy, or off-balance.  Take over-the-counter and prescription medicines only as told by your health care provider. These include any supplements.  Eat a healthy diet and maintain a healthy weight. A healthy diet includes low-fat dairy products, low-fat (lean) meats, and fiber from whole grains, beans, and lots of fruits and vegetables. Home safety  Remove any tripping hazards, such as rugs, cords, and clutter.  Install safety equipment such as grab bars in bathrooms and safety rails on stairs.  Keep rooms and walkways well-lit.  Activity   Follow a regular exercise program to stay fit. This will help you maintain your balance. Ask your health care provider what types of exercise are appropriate for you.  If you need a cane or walker, use it as recommended by your health care provider.  Wear supportive shoes that have nonskid soles. Lifestyle  Do not drink alcohol if your health care provider tells you not to drink.  If you  drink alcohol, limit how much you have: ? 0-1 drink a day for women. ? 0-2 drinks a day for men.  Be aware of how much alcohol is in your drink. In the [place] one drink equals one typical bottle of beer (12 oz), one-half glass of wine (5 oz), or one shot of hard liquor (1 oz).  Do not use any products that contain nicotine or tobacco, such as cigarettes and e-cigarettes. If you need help quitting, ask your health care provider. Summary  Having a healthy lifestyle and getting preventive care can help to protect your health and wellness after age 56.  Screening and testing are the best way to find a health problem early and help you avoid having a fall. Early diagnosis and treatment give you the best chance for managing medical conditions that are more common for people who are older than age 17.  Falls are a major cause of broken bones and head injuries in people who are older than age 64. Take precautions to prevent a fall at home.  Work with your health care provider to learn what changes you can make to improve your health and wellness and to prevent falls. This information is not intended to replace advice given to you by your health care provider. Make sure you discuss any questions you have with your health care provider. Document Revised: 03/02/2019 Document Reviewed: 09/22/2017 Elsevier Patient Education  2020 [employer].

## 2020-03-06 LAB — URINALYSIS, COMPLETE W/RFL CULTURE

## 2020-03-06 LAB — NO CULTURE INDICATED

## 2020-03-07 ENCOUNTER — Ambulatory Visit: Payer: Commercial Managed Care - PPO | Admitting: Podiatry

## 2020-03-07 ENCOUNTER — Other Ambulatory Visit: Payer: Self-pay

## 2020-03-07 VITALS — Temp 97.3°F

## 2020-03-07 DIAGNOSIS — M7750 Other enthesopathy of unspecified foot: Secondary | ICD-10-CM

## 2020-03-07 DIAGNOSIS — M7751 Other enthesopathy of right foot: Secondary | ICD-10-CM

## 2020-03-07 LAB — COMPREHENSIVE METABOLIC PANEL
AG Ratio: 1.8 (calc) (ref 1.0–2.5)
ALT: 21 U/L (ref 6–29)
AST: 20 U/L (ref 10–35)
Albumin: 4.6 g/dL (ref 3.6–5.1)
Alkaline phosphatase (APISO): 51 U/L (ref 37–153)
BUN: 15 mg/dL (ref 7–25)
CO2: 24 mmol/L (ref 20–32)
Calcium: 9.4 mg/dL (ref 8.6–10.4)
Chloride: 103 mmol/L (ref 98–110)
Creat: 0.96 mg/dL (ref 0.50–0.99)
Globulin: 2.5 g/dL (calc) (ref 1.9–3.7)
Glucose, Bld: 91 mg/dL (ref 65–99)
Potassium: 4.2 mmol/L (ref 3.5–5.3)
Sodium: 139 mmol/L (ref 135–146)
Total Bilirubin: 0.3 mg/dL (ref 0.2–1.2)
Total Protein: 7.1 g/dL (ref 6.1–8.1)

## 2020-03-07 LAB — CBC WITH DIFFERENTIAL/PLATELET
Absolute Monocytes: 560 cells/uL (ref 200–950)
Basophils Absolute: 50 cells/uL (ref 0–200)
Basophils Relative: 1 %
Eosinophils Absolute: 110 cells/uL (ref 15–500)
Eosinophils Relative: 2.2 %
HCT: 42.6 % (ref 35.0–45.0)
Hemoglobin: 14 g/dL (ref 11.7–15.5)
Lymphs Abs: 1695 cells/uL (ref 850–3900)
MCH: 30.3 pg (ref 27.0–33.0)
MCHC: 32.9 g/dL (ref 32.0–36.0)
MCV: 92.2 fL (ref 80.0–100.0)
MPV: 10.3 fL (ref 7.5–12.5)
Monocytes Relative: 11.2 %
Neutro Abs: 2585 cells/uL (ref 1500–7800)
Neutrophils Relative %: 51.7 %
Platelets: 274 10*3/uL (ref 140–400)
RBC: 4.62 10*6/uL (ref 3.80–5.10)
RDW: 12.4 % (ref 11.0–15.0)
Total Lymphocyte: 33.9 %
WBC: 5 10*3/uL (ref 3.8–10.8)

## 2020-03-07 LAB — LIPID PANEL
Cholesterol: 228 mg/dL — ABNORMAL HIGH (ref <200)
HDL: 77 mg/dL (ref >=50)
LDL Cholesterol (Calc): 130 mg/dL (calc) — ABNORMAL HIGH
Non-HDL Cholesterol (Calc): 151 mg/dL (calc) — ABNORMAL HIGH (ref <130)
Total CHOL/HDL Ratio: 3 (calc) (ref <5.0)
Triglycerides: 104 mg/dL (ref <150)

## 2020-03-07 MED ORDER — MELOXICAM 15 MG PO TABS: 15.0000 mg | ORAL_TABLET | Freq: Every day | ORAL | 0 refills | Status: DC

## 2020-03-18 ENCOUNTER — Encounter: Payer: Self-pay | Admitting: Podiatry

## 2020-03-18 ENCOUNTER — Encounter: Payer: Self-pay | Admitting: Women's Health

## 2020-03-18 NOTE — Progress Notes (Signed)
FORM FAXED

## 2020-05-06 NOTE — Progress Notes (Signed)
  Subjective:  Patient ID: Kimberly Hale, female    DOB: 02-09-1956,  MRN: [id number]  Chief Complaint  Patient presents with  . Follow-up    R ankle tendonitis. Pt stated, "I don't usually have pain when I wear the brace, but I don't want to wear it for the rest of my life. Pain = 4-5/10 when I'm not wearing the brace (nights and weekends)".   64 y.o. female presents with the above complaint. History confirmed with patient.  Objective:  Physical Exam: warm, good capillary refill, no trophic changes or ulcerative lesions, normal DP and PT pulses and normal sensory exam. Left Foot: tenderness along the peroneals  Right Foot: tenderness of the 2nd metatarsal head, hallux varus, hammertoes noted  Assessment:   1. Tendonitis of ankle or foot   2. Capsulitis of metatarsophalangeal (MTP) joint of right foot    Plan:  Patient was evaluated and treated and all questions answered.  Capsulitis, Right Ankle PT tendonitis -Rx meloxicam for reduction of inflammation -Continue brace  No follow-ups on file.

## 2020-05-28 ENCOUNTER — Telehealth: Payer: Self-pay | Admitting: *Deleted

## 2020-05-28 MED ORDER — NONFORMULARY OR COMPOUNDED ITEM: 5 refills | Status: DC

## 2020-05-28 NOTE — Telephone Encounter (Signed)
I sent MyChart message to pt explaining the Volteran Gel was OTC and less than $20.00/tube, and offered the [facility] Pain Cream #17, with explanation to chose which topical fit her current needs medically and financially, just to let Dr. March Rummage know which she chose. Faxed orders to [facility].

## 2020-06-18 ENCOUNTER — Encounter: Payer: Self-pay | Admitting: Nurse Practitioner

## 2021-03-17 ENCOUNTER — Other Ambulatory Visit: Payer: Self-pay

## 2021-03-17 MED ORDER — ALENDRONATE SODIUM 70 MG PO TABS: 70.0000 mg | ORAL_TABLET | ORAL | 0 refills | Status: DC

## 2021-03-17 NOTE — Telephone Encounter (Signed)
Medication refill request: fosamax Last AEX:  03-05-2020 Next AEX: 03-31-2021 Last MMG (if hormonal medication request): n/a Refill authorized: please approve until appt if appropriate

## 2021-03-24 ENCOUNTER — Other Ambulatory Visit: Payer: Self-pay | Admitting: *Deleted

## 2021-03-24 DIAGNOSIS — B009 Herpesviral infection, unspecified: Secondary | ICD-10-CM

## 2021-03-24 MED ORDER — VALACYCLOVIR HCL 500 MG PO TABS: ORAL_TABLET | ORAL | 0 refills | Status: DC

## 2021-03-24 NOTE — Telephone Encounter (Signed)
Medication refill request: Valtrex  Last AEX:  03-05-20 [place]  Next AEX: 03-31-21 TW Last MMG (if hormonal medication request): n/a Refill authorized: Today, please advise.   Medication pended for #30, 0RF. Please refill if appropriate.

## 2021-03-31 ENCOUNTER — Encounter: Payer: Self-pay | Admitting: Nurse Practitioner

## 2021-03-31 ENCOUNTER — Other Ambulatory Visit: Payer: Self-pay

## 2021-03-31 ENCOUNTER — Ambulatory Visit (INDEPENDENT_AMBULATORY_CARE_PROVIDER_SITE_OTHER): Payer: Commercial Managed Care - PPO | Admitting: Nurse Practitioner

## 2021-03-31 VITALS — BP 130/70 | HR 80 | Resp 16 | Ht 64.25 in | Wt 165.0 lb

## 2021-03-31 DIAGNOSIS — N644 Mastodynia: Secondary | ICD-10-CM | POA: Diagnosis not present

## 2021-03-31 DIAGNOSIS — R1310 Dysphagia, unspecified: Secondary | ICD-10-CM

## 2021-03-31 DIAGNOSIS — Z780 Asymptomatic menopausal state: Secondary | ICD-10-CM

## 2021-03-31 DIAGNOSIS — Z01419 Encounter for gynecological examination (general) (routine) without abnormal findings: Secondary | ICD-10-CM

## 2021-03-31 DIAGNOSIS — F419 Anxiety disorder, unspecified: Secondary | ICD-10-CM

## 2021-03-31 DIAGNOSIS — M810 Age-related osteoporosis without current pathological fracture: Secondary | ICD-10-CM

## 2021-03-31 LAB — CBC WITH DIFFERENTIAL/PLATELET
Absolute Monocytes: 557 cells/uL (ref 200–950)
Basophils Absolute: 58 cells/uL (ref 0–200)
Basophils Relative: 1 %
Eosinophils Absolute: 151 cells/uL (ref 15–500)
Eosinophils Relative: 2.6 %
HCT: 40.8 % (ref 35.0–45.0)
Hemoglobin: 13.9 g/dL (ref 11.7–15.5)
Lymphs Abs: 1630 cells/uL (ref 850–3900)
MCH: 30.4 pg (ref 27.0–33.0)
MCHC: 34.1 g/dL (ref 32.0–36.0)
MCV: 89.3 fL (ref 80.0–100.0)
MPV: 10.6 fL (ref 7.5–12.5)
Monocytes Relative: 9.6 %
Neutro Abs: 3405 cells/uL (ref 1500–7800)
Neutrophils Relative %: 58.7 %
Platelets: 248 10*3/uL (ref 140–400)
RBC: 4.57 10*6/uL (ref 3.80–5.10)
RDW: 13.1 % (ref 11.0–15.0)
Total Lymphocyte: 28.1 %
WBC: 5.8 10*3/uL (ref 3.8–10.8)

## 2021-03-31 LAB — LIPID PANEL
Cholesterol: 191 mg/dL (ref <200)
HDL: 68 mg/dL (ref >=50)
LDL Cholesterol (Calc): 96 mg/dL (calc)
Non-HDL Cholesterol (Calc): 123 mg/dL (calc) (ref <130)
Total CHOL/HDL Ratio: 2.8 (calc) (ref <5.0)
Triglycerides: 174 mg/dL — ABNORMAL HIGH (ref <150)

## 2021-03-31 LAB — COMPREHENSIVE METABOLIC PANEL
AG Ratio: 2.1 (calc) (ref 1.0–2.5)
ALT: 27 U/L (ref 6–29)
AST: 23 U/L (ref 10–35)
Albumin: 4.6 g/dL (ref 3.6–5.1)
Alkaline phosphatase (APISO): 56 U/L (ref 37–153)
BUN: 10 mg/dL (ref 7–25)
CO2: 27 mmol/L (ref 20–32)
Calcium: 9.3 mg/dL (ref 8.6–10.4)
Chloride: 105 mmol/L (ref 98–110)
Creat: 0.81 mg/dL (ref 0.50–0.99)
Globulin: 2.2 g/dL (calc) (ref 1.9–3.7)
Glucose, Bld: 118 mg/dL — ABNORMAL HIGH (ref 65–99)
Potassium: 3.8 mmol/L (ref 3.5–5.3)
Sodium: 140 mmol/L (ref 135–146)
Total Bilirubin: 0.4 mg/dL (ref 0.2–1.2)
Total Protein: 6.8 g/dL (ref 6.1–8.1)

## 2021-03-31 MED ORDER — ALENDRONATE SODIUM 70 MG PO TABS: 70.0000 mg | ORAL_TABLET | ORAL | 2 refills | Status: DC

## 2021-03-31 NOTE — Progress Notes (Signed)
Kimberly Hale 1956/08/26 [id number]   History:  65 y.o. [id number] presents for annual exam. Postmenopausal - no HRT, no bleeding. Normal pap and mammogram history. Osteoporosis - on Fosamax since 05/2019. For 4-6 months she has noticed swallowing is more difficult when consuming thin liquids. Denies pain, burning, choking, or feeling like food is getting stuck. She also complains of left breast pain that started 1 month ago. She describes the pain as sore, intermittent, and mild. Denies nipple discharge, redness, swelling, or mass. She is retiring the end of June to care for ex husband who has a progressive neurological disorder.   Gynecologic History Patient's last menstrual period was 10/08/2007.   Contraception/Family planning: post menopausal status  Health Maintenance Last Pap: 01/09/2019. Results were: normal Last mammogram: 06/18/2020. Results were: normal Last colonoscopy: 2015 per patient. Results were: normal Last Dexa: 06/08/2019. Results were: T-score -2.7  Past medical history, past surgical history, family history and social history were all reviewed and documented in the EPIC chart. Divorced. 1 son who lives close. 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   ROS:  A ROS was performed and pertinent positives and negatives are included.  Exam:  Vitals:   03/31/21 1332  BP: 130/70  Pulse: 80  Resp: 16  Weight: 165 lb (74.8 kg)  Height: 5' 4.25" (1.632 m)   Body mass index is 28.1 kg/m.  General appearance:  Normal Thyroid:  Symmetrical, normal in size, without palpable masses or nodularity. Respiratory  Auscultation:  Clear without wheezing or rhonchi Cardiovascular  Auscultation:  Regular rate, without rubs, murmurs or gallops  Edema/varicosities:  Not grossly evident Abdominal  Soft,nontender, without masses, guarding or rebound.  Liver/spleen:  No organomegaly noted  Hernia:  None appreciated  Skin  Inspection:  Grossly normal Breasts: Examined lying and  sitting.   Right: Without masses, retractions, nipple discharge or axillary adenopathy.   Left: Without masses, retractions, nipple discharge or axillary adenopathy. Genitourinary   Inguinal/mons:  Normal without inguinal adenopathy  External genitalia:  Normal appearing vulva with no masses, tenderness, or lesions  BUS/Urethra/Skene's glands:  Normal  Vagina:  Normal appearing with normal color and discharge, no lesions. Atrophic changes  Cervix:  Normal appearing without discharge or lesions  Uterus:  Normal in size, shape and contour.  Midline and mobile, nontender  Adnexa/parametria:     Rt: Normal in size, without masses or tenderness.   Lt: Normal in size, without masses or tenderness.  Anus and perineum: Normal  Digital rectal exam: Normal sphincter tone without palpated masses or tenderness  Assessment/Plan:  65 y.o. [id number] for annual exam.   Well female exam with routine gynecological exam - Plan: CBC with Differential/Platelet, Comprehensive metabolic panel, Lipid panel. Education provided on SBEs, importance of preventative screenings, current guidelines, high calcium diet, regular exercise, and multivitamin daily.   Age-related osteoporosis without current pathological fracture - Plan: DG Bone Density, alendronate (FOSAMAX) 70 MG tablet weekly. We will continue for now until she is seen by ENT. If ENT warrants discontinuation we discussed switching to Prolia. She will schedule DXA in July.   Postmenopausal - Plan: DG Bone Density. No HRT, no bleeding.   Pain of left breast - She complains of left breast pain that started 1 month ago. She describes the pain as sore, intermittent, and mild. Denies nipple discharge, redness, swelling, or mass. Normal mammogram history. Most recent 05/2020. Normal breast exam today. Will send referral for breast ultrasound.   Dysphagia, unspecified type -  For 4-6 months  she has noticed swallowing is more difficult when consuming thin liquids.  Denies pain, burning, choking, or feel like food is getting stuck. We discussed that this could be a side effect of Fosamax due to esophageal erosion but further evaluation is needed.   Screening for cervical cancer - Normal Pap history.  Discussed current guidelines and the option to stop screening and she is agreeable. We will reassess on an annual basis.   Screening for colon cancer - 2015 colonoscopy. Will repeat at GI's recommended interval.   Return in 1 year for annual.    Tamela Gammon DNP, 2:02 PM 03/31/2021

## 2021-03-31 NOTE — Patient Instructions (Signed)
Health Maintenance After Age 67 After age 67, you are at a higher risk for certain long-term diseases and infections as well as injuries from falls. Falls are a major cause of broken bones and head injuries in people who are older than age 67. Getting regular preventive care can help to keep you healthy and well. Preventive care includes getting regular testing and making lifestyle changes as recommended by your health care provider. Talk with your health care provider about:  Which screenings and tests you should have. A screening is a test that checks for a disease when you have no symptoms.  A diet and exercise plan that is right for you. What should I know about screenings and tests to prevent falls? Screening and testing are the best ways to find a health problem early. Early diagnosis and treatment give you the best chance of managing medical conditions that are common after age 67. Certain conditions and lifestyle choices may make you more likely to have a fall. Your health care provider may recommend:  Regular vision checks. Poor vision and conditions such as cataracts can make you more likely to have a fall. If you wear glasses, make sure to get your prescription updated if your vision changes.  Medicine review. Work with your health care provider to regularly review all of the medicines you are taking, including over-the-counter medicines. Ask your health care provider about any side effects that may make you more likely to have a fall. Tell your health care provider if any medicines that you take make you feel dizzy or sleepy.  Osteoporosis screening. Osteoporosis is a condition that causes the bones to get weaker. This can make the bones weak and cause them to break more easily.  Blood pressure screening. Blood pressure changes and medicines to control blood pressure can make you feel dizzy.  Strength and balance checks. Your health care provider may recommend certain tests to check your  strength and balance while standing, walking, or changing positions.  Foot health exam. Foot pain and numbness, as well as not wearing proper footwear, can make you more likely to have a fall.  Depression screening. You may be more likely to have a fall if you have a fear of falling, feel emotionally low, or feel unable to do activities that you used to do.  Alcohol use screening. Using too much alcohol can affect your balance and may make you more likely to have a fall. What actions can I take to lower my risk of falls? General instructions  Talk with your health care provider about your risks for falling. Tell your health care provider if: ? You fall. Be sure to tell your health care provider about all falls, even ones that seem minor. ? You feel dizzy, sleepy, or off-balance.  Take over-the-counter and prescription medicines only as told by your health care provider. These include any supplements.  Eat a healthy diet and maintain a healthy weight. A healthy diet includes low-fat dairy products, low-fat (lean) meats, and fiber from whole grains, beans, and lots of fruits and vegetables. Home safety  Remove any tripping hazards, such as rugs, cords, and clutter.  Install safety equipment such as grab bars in bathrooms and safety rails on stairs.  Keep rooms and walkways well-lit. Activity  Follow a regular exercise program to stay fit. This will help you maintain your balance. Ask your health care provider what types of exercise are appropriate for you.  If you need a cane or walker,   use it as recommended by your health care provider.  Wear supportive shoes that have nonskid soles.   Lifestyle  Do not drink alcohol if your health care provider tells you not to drink.  If you drink alcohol, limit how much you have: ? 0-1 drink a day for women. ? 0-2 drinks a day for men.  Be aware of how much alcohol is in your drink. In the [place] one drink equals one typical bottle of beer (12  oz), one-half glass of wine (5 oz), or one shot of hard liquor (1 oz).  Do not use any products that contain nicotine or tobacco, such as cigarettes and e-cigarettes. If you need help quitting, ask your health care provider. Summary  Having a healthy lifestyle and getting preventive care can help to protect your health and wellness after age 67.  Screening and testing are the best way to find a health problem early and help you avoid having a fall. Early diagnosis and treatment give you the best chance for managing medical conditions that are more common for people who are older than age 67.  Falls are a major cause of broken bones and head injuries in people who are older than age 67. Take precautions to prevent a fall at home.  Work with your health care provider to learn what changes you can make to improve your health and wellness and to prevent falls. This information is not intended to replace advice given to you by your health care provider. Make sure you discuss any questions you have with your health care provider. Document Revised: 03/02/2019 Document Reviewed: 09/22/2017 Elsevier Patient Education  2021 Elsevier Inc.  

## 2021-04-01 ENCOUNTER — Telehealth: Payer: Self-pay | Admitting: *Deleted

## 2021-04-01 DIAGNOSIS — R1310 Dysphagia, unspecified: Secondary | ICD-10-CM

## 2021-04-01 MED ORDER — ALPRAZOLAM 0.25 MG PO TABS: 0.2500 mg | ORAL_TABLET | Freq: Every evening | ORAL | 1 refills | Status: DC | PRN

## 2021-04-01 NOTE — Telephone Encounter (Addendum)
Appointment for [facility] on 05/20/21 @ 10:45am  Referral placed at [facility], they will call patient to schedule or patient can call to schedule.  My chart message sent to patient to relaying this information.

## 2021-04-01 NOTE — Telephone Encounter (Signed)
-----   Message from Tamela Gammon, NP sent at 03/31/2021  4:01 PM EDT ----- Please send referral for left breast ultrasound for pain. Also a referral to ENT for dysphagia.  Thank you!

## 2021-04-07 NOTE — Telephone Encounter (Signed)
Patient scheduled on 05/05/21 with Rozetta Nunnery, MD

## 2021-05-05 ENCOUNTER — Encounter (INDEPENDENT_AMBULATORY_CARE_PROVIDER_SITE_OTHER): Payer: Self-pay | Admitting: Otolaryngology

## 2021-05-05 ENCOUNTER — Other Ambulatory Visit: Payer: Self-pay

## 2021-05-05 ENCOUNTER — Ambulatory Visit (INDEPENDENT_AMBULATORY_CARE_PROVIDER_SITE_OTHER): Payer: Commercial Managed Care - PPO | Admitting: Otolaryngology

## 2021-05-05 VITALS — Temp 97.7°F

## 2021-05-05 DIAGNOSIS — R1314 Dysphagia, pharyngoesophageal phase: Secondary | ICD-10-CM | POA: Diagnosis not present

## 2021-05-05 DIAGNOSIS — K219 Gastro-esophageal reflux disease without esophagitis: Secondary | ICD-10-CM | POA: Diagnosis not present

## 2021-05-05 NOTE — Progress Notes (Signed)
HPI: Kimberly Hale is a 65 y.o. female who presents is referred by Marny Lowenstein, NP for evaluation of dysphagia.  Patient has noted recently intermittent difficulty swallowing sometimes just liquids sometimes food.  She is on Fosamax because of osteopenia and apparently a side effect of Fosamax is dysphagia. She is also followed by gastroenterology and has been on famotidine twice daily for reflux disease for a number of years. She is referred here because of recent problems with dysphagia. She denies any sore throat.  She has had no hoarseness..  Past Medical History:  Diagnosis Date   Anxiety    Environmental allergies    GERD (gastroesophageal reflux disease)    Grover's disease    flares up intermittently ; more flares during cold season; presents on the trunk of body per patient    Headache    tension headaches occasionally    History of ETOH abuse    NONE IN 10 YEARS   Hypertension    Pneumonia    x3 ; twice in teens; once in late 30s/40s   Postmenopausal osteoporosis 05/2019   T score -2.7   Rosacea    Past Surgical History:  Procedure Laterality Date   ARTHRODESIS FOOT WITH WEIL OSTEOTOMY AND CHILECTOMY Right    BUNIONECTOMY     [place]   OPEN SURGICAL REPAIR OF GLUTEAL TENDON Left 05/05/2017   Procedure: Left hip bursectomy; gluteal tendon repair;  Surgeon: Gaynelle Arabian, MD;  Location: [facility];  Service: Orthopedics;  Laterality: Left;   THERAPEUTIC ABORTION     X2   TONSILLECTOMY     Social History   Socioeconomic History   Marital status: Divorced    Spouse name: Not on file   Number of children: Not on file   Years of education: Not on file   Highest education level: Not on file  Occupational History   Not on file  Tobacco Use   Smoking status: Former    Years: 10.00    Pack years: 0.00    Types: Cigarettes   Smokeless tobacco: Never   Tobacco comments:    31 years ago   Vaping Use    Vaping Use: Never used  Substance and Sexual Activity   Alcohol use: No    Alcohol/week: 0.0 standard drinks   Drug use: Never   Sexual activity: Not Currently    Birth control/protection: Post-menopausal  Other Topics Concern   Not on file  Social History Narrative   Not on file   Social Determinants of Health   Financial Resource Strain: Not on file  Food Insecurity: Not on file  Transportation Needs: Not on file  Physical Activity: Not on file  Stress: Not on file  Social Connections: Not on file   Family History  Problem Relation Age of Onset   Alcohol abuse Mother    Heart disease Father    Alcohol abuse Father    Diabetes Maternal Grandmother    Hypertension Maternal Grandmother    Allergies  Allergen Reactions   Other Itching and Swelling    CILANTRO,  TONGUE SWELLING AND ITCHING    Sulfa Antibiotics Other (See Comments)    Does not  remember what kind of reaction she has   Prior to Admission medications   Medication Sig Start Date End Date Taking? Authorizing Provider  alendronate (FOSAMAX) 70 MG tablet Take 1 tablet (70 mg total) by mouth every 7 (  seven) days. Take with a full glass of water on an empty stomach. 03/31/21   Tamela Gammon, NP  ALPRAZolam Duanne Moron) 0.25 MG tablet Take 1 tablet (0.25 mg total) by mouth at bedtime as needed. for sleep 04/01/21   Marny Lowenstein A, NP  amLODipine (NORVASC) 5 MG tablet Take 1 tablet by mouth daily. 03/24/21   [provider]  busPIRone (BUSPAR) 15 MG tablet Take 7.5-15 mg by mouth See admin instructions. 7.5 mg in the morning, and 15 mg at bedtime    [provider]  Butalbital-APAP-Caffeine 50-300-40 MG CAPS Take 1 tablet by mouth daily as needed.    [provider]  cholecalciferol (VITAMIN D) 1000 UNITS tablet Take 1,000 Units by mouth daily.    [provider]  diphenhydrAMINE HCl 50 MG/30ML LIQD Take 15 mLs by mouth at bedtime as needed.    [provider]  docusate  sodium (COLACE) 100 MG capsule Take 200 mg by mouth at bedtime.    [provider]  famotidine (PEPCID) 20 MG tablet Take 20 mg by mouth at bedtime.     [provider]  fluticasone (FLONASE) 50 MCG/ACT nasal spray Place 1 spray into the nose at bedtime.    [provider]  metroNIDAZOLE (METROCREAM) 0.75 % cream Apply 1 application topically 2 (two) times daily.    [provider]  Multiple Vitamin (MULTIVITAMIN) tablet Take 1 tablet by mouth daily.    [provider]  NONFORMULARY OR COMPOUNDED ITEM [facility]:  Pain Cream #17 - Diclofenac 3%, Baclofen 2%, Gabapentin 5%, Lidocaine 5%, Menthol 1%. Apply 1-2 grams to affected area 3-4 times daily prn pain. 05/28/20   Evelina Bucy, DPM  omega-3 acid ethyl esters (LOVAZA) 1 G capsule Take 1 g by mouth daily.     [provider]  Red Yeast Rice Extract (RED YEAST RICE PO) Take 1 tablet by mouth daily.    [provider]  senna (SENOKOT) 8.6 MG TABS tablet Take 1 tablet by mouth daily as needed for mild constipation.    [provider]  valACYclovir (VALTREX) 500 MG tablet TAKE ONE TABLET BY MOUTH TWICE A DAY X 3 -5 DAYS 03/24/21   Marny Lowenstein A, NP     Positive ROS: Otherwise negative  All other systems have been reviewed and were otherwise negative with the exception of those mentioned in the HPI and as above.  Physical Exam: Constitutional: Alert, well-appearing, no acute distress Ears: External ears without lesions or tenderness. Ear canals are clear bilaterally with intact, clear TMs.  Nasal: External nose without lesions. Septum relatively midline with mild rhinitis.  Both middle meatus regions are clear..  Oral: Lips and gums without lesions. Tongue and palate mucosa without lesions. Posterior oropharynx clear.  Patient is status post tonsillectomy and tonsil fossa's appear clear.  Indirect laryngoscopy revealed a clear base of tongue vallecula and  epiglottis. Fiberoptic laryngoscopy was performed through the right nostril.  Nasopharynx was clear.  The base of tongue vallecula and epiglottis were normal.  Vocal cords were clear bilaterally with normal vocal cord mobility.  The fiberoptic laryngoscope was passed through the upper esophageal sphincter without any difficulty.  Clear piriform sinuses bilaterally. Neck: No palpable adenopathy or masses.  No palpable adenopathy along the jugular chain of lymph nodes. Respiratory: Breathing comfortably  Skin: No facial/neck lesions or rash noted.  Laryngoscopy  Date/Time: 05/05/2021 4:25 PM Performed by: Rozetta Nunnery, MD Authorized by: Rozetta Nunnery, MD  Consent:    Consent obtained:  Verbal   Consent given by:  Patient Procedure details:    Indications: direct visualization of the upper aerodigestive tract     Medication:  Afrin   Instrument: flexible fiberoptic laryngoscope     Scope location: right nare   Sinus:    Right middle meatus: normal     Right nasopharynx: normal   Mouth:    Oropharynx: normal     Vallecula: normal     Base of tongue: normal     Epiglottis: normal   Throat:    Pyriform sinus: normal     True vocal cords: normal   Comments:     On fiberoptic laryngoscopy the hypopharynx and larynx was clear to examination with no significant pathology noted.  The fiberoptic laryngoscope was passed through the upper esophageal sphincter without difficulty and the upper cervical esophagus was clear.  Assessment: Dysphagia questionable etiology.  Possibly related to Fosamax.  Clear upper airway examination on fiberoptic laryngoscopy with no pathologic abnormalities noted. Patient does apparently have history of GE reflux disease that is treated by famotidine by her gastroenterologist.  Plan: Reassured patient of normal upper airway examination on indirect laryngoscopy as well as fiberoptic laryngoscopy. Questioned cause of dysphagia although it possibly  could be related to Fosamax although I am not familiar with side effects of Fosamax.  But no pathologic abnormalities noted on fiberoptic laryngoscopy.   Radene Journey, MD   CC:

## 2021-05-20 ENCOUNTER — Encounter: Payer: Self-pay | Admitting: Nurse Practitioner

## 2021-06-01 ENCOUNTER — Other Ambulatory Visit: Payer: Self-pay | Admitting: Nurse Practitioner

## 2021-06-01 DIAGNOSIS — M810 Age-related osteoporosis without current pathological fracture: Secondary | ICD-10-CM

## 2021-06-02 NOTE — Telephone Encounter (Signed)
Tiffany patient saw ENT on 05/05/21 see note in epic

## 2021-06-10 ENCOUNTER — Telehealth: Payer: Self-pay | Admitting: *Deleted

## 2021-06-10 DIAGNOSIS — F419 Anxiety disorder, unspecified: Secondary | ICD-10-CM

## 2021-06-10 MED ORDER — ALPRAZOLAM 0.25 MG PO TABS: 0.2500 mg | ORAL_TABLET | Freq: Every evening | ORAL | 1 refills | Status: DC | PRN

## 2021-06-10 NOTE — Telephone Encounter (Signed)
Refill signed. Thank you.

## 2021-06-10 NOTE — Telephone Encounter (Signed)
Patient informed with below note. 

## 2021-06-10 NOTE — Telephone Encounter (Signed)
Patient called stating she never received her refill for Xanax 0.25 mg tablet on 04/01/21. It appears the Rx printed and was never called in to the pharmacy. Medication is pending for your approval and placed on normal so it will go electronically.

## 2021-06-10 NOTE — Telephone Encounter (Signed)
She has a DXA scheduled for next week. We will determine treatment after that. It is probably best to switch to different medication due to swallowing issues since we are unsure if the Fosamax is causing these problems. We will discuss after DXA. I will decline refill for now.

## 2021-06-17 ENCOUNTER — Ambulatory Visit (INDEPENDENT_AMBULATORY_CARE_PROVIDER_SITE_OTHER): Payer: Medicare HMO

## 2021-06-17 ENCOUNTER — Other Ambulatory Visit: Payer: Self-pay | Admitting: Nurse Practitioner

## 2021-06-17 ENCOUNTER — Other Ambulatory Visit: Payer: Self-pay

## 2021-06-17 DIAGNOSIS — Z780 Asymptomatic menopausal state: Secondary | ICD-10-CM

## 2021-06-17 DIAGNOSIS — M810 Age-related osteoporosis without current pathological fracture: Secondary | ICD-10-CM | POA: Diagnosis not present

## 2021-07-06 ENCOUNTER — Other Ambulatory Visit: Payer: Self-pay | Admitting: Nurse Practitioner

## 2021-07-06 DIAGNOSIS — B009 Herpesviral infection, unspecified: Secondary | ICD-10-CM

## 2021-07-07 NOTE — Telephone Encounter (Signed)
Med refill request: Valtrex '500mg'$  BID for 3-5 days with outbreak Last AEX: 03/31/21 Next AEX: Not scheduled Last MMG (if hormonal med) N/A Refill authorized: Please Advise?

## 2021-07-09 ENCOUNTER — Encounter: Payer: Self-pay | Admitting: Nurse Practitioner

## 2021-07-10 ENCOUNTER — Other Ambulatory Visit: Payer: Self-pay | Admitting: Nurse Practitioner

## 2021-07-10 DIAGNOSIS — M810 Age-related osteoporosis without current pathological fracture: Secondary | ICD-10-CM

## 2021-07-10 DIAGNOSIS — B009 Herpesviral infection, unspecified: Secondary | ICD-10-CM

## 2021-07-10 MED ORDER — ALENDRONATE SODIUM 70 MG PO TABS: 70.0000 mg | ORAL_TABLET | ORAL | 3 refills | Status: DC

## 2021-07-10 MED ORDER — VALACYCLOVIR HCL 500 MG PO TABS: ORAL_TABLET | ORAL | 4 refills | Status: DC

## 2021-09-08 DIAGNOSIS — Z23 Encounter for immunization: Secondary | ICD-10-CM | POA: Diagnosis not present

## 2021-11-25 DIAGNOSIS — Z23 Encounter for immunization: Secondary | ICD-10-CM | POA: Diagnosis not present

## 2021-11-25 DIAGNOSIS — L821 Other seborrheic keratosis: Secondary | ICD-10-CM | POA: Diagnosis not present

## 2021-11-25 DIAGNOSIS — L814 Other melanin hyperpigmentation: Secondary | ICD-10-CM | POA: Diagnosis not present

## 2021-11-25 DIAGNOSIS — D225 Melanocytic nevi of trunk: Secondary | ICD-10-CM | POA: Diagnosis not present

## 2021-11-25 DIAGNOSIS — L820 Inflamed seborrheic keratosis: Secondary | ICD-10-CM | POA: Diagnosis not present

## 2021-11-25 DIAGNOSIS — L570 Actinic keratosis: Secondary | ICD-10-CM | POA: Diagnosis not present

## 2021-11-25 DIAGNOSIS — L720 Epidermal cyst: Secondary | ICD-10-CM | POA: Diagnosis not present

## 2021-11-25 DIAGNOSIS — L578 Other skin changes due to chronic exposure to nonionizing radiation: Secondary | ICD-10-CM | POA: Diagnosis not present

## 2022-04-01 ENCOUNTER — Encounter: Payer: Self-pay | Admitting: Nurse Practitioner

## 2022-04-01 ENCOUNTER — Ambulatory Visit (INDEPENDENT_AMBULATORY_CARE_PROVIDER_SITE_OTHER): Payer: Medicare HMO | Admitting: Nurse Practitioner

## 2022-04-01 VITALS — BP 124/78 | HR 88 | Ht 63.5 in | Wt 157.0 lb

## 2022-04-01 DIAGNOSIS — B009 Herpesviral infection, unspecified: Secondary | ICD-10-CM | POA: Diagnosis not present

## 2022-04-01 DIAGNOSIS — M810 Age-related osteoporosis without current pathological fracture: Secondary | ICD-10-CM

## 2022-04-01 DIAGNOSIS — Z9189 Other specified personal risk factors, not elsewhere classified: Secondary | ICD-10-CM

## 2022-04-01 DIAGNOSIS — Z780 Asymptomatic menopausal state: Secondary | ICD-10-CM

## 2022-04-01 DIAGNOSIS — Z01419 Encounter for gynecological examination (general) (routine) without abnormal findings: Secondary | ICD-10-CM

## 2022-04-01 MED ORDER — ALENDRONATE SODIUM 70 MG PO TABS: 70.0000 mg | ORAL_TABLET | ORAL | 3 refills | Status: DC

## 2022-04-01 NOTE — Progress Notes (Signed)
? ?  Kimberly Hale September 21, 1956 [id number] ? ? ?History:  66 y.o. [id number] presents for annual exam. Postmenopausal - no HRT, no bleeding. Normal pap and mammogram history. Osteoporosis - on Fosamax since 05/2019. Saw ENT last year for dysphagia with normal evaluation and clearance to continue Fosamax. HSV, takes Valtrex as needed.  ? ?Gynecologic History ?Patient's last menstrual period was 10/08/2007. ?  ?Contraception/Family planning: post menopausal status ?Sexually active: No ? ?Health Maintenance ?Last Pap: 01/09/2019. Results were: Normal ?Last mammogram: 05/20/2021. Results were: Normal ?Last colonoscopy: 2015 per patient. Results were: Normal ?Last Dexa: 06/17/2021. Results were: T-score -2.7 ? ?Past medical history, past surgical history, family history and social history were all reviewed and documented in the EPIC chart. In relationship with ex husband. He has a progressive neurological disease. 1 son who lives close. 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  ? ?ROS:  A ROS was performed and pertinent positives and negatives are included. ? ?Exam: ? ?Vitals:  ? 04/01/22 1529  ?BP: 124/78  ?Pulse: 88  ?SpO2: 98%  ?Weight: 157 lb (71.2 kg)  ?Height: 5' 3.5" (1.613 m)  ? ? ?Body mass index is 27.38 kg/m?. ? ?General appearance:  Normal ?Thyroid:  Symmetrical, normal in size, without palpable masses or nodularity. ?Respiratory ? Auscultation:  Clear without wheezing or rhonchi ?Cardiovascular ? Auscultation:  Regular rate, without rubs, murmurs or gallops ? Edema/varicosities:  Not grossly evident ?Abdominal ? Soft,nontender, without masses, guarding or rebound. ? Liver/spleen:  No organomegaly noted ? Hernia:  None appreciated ? Skin ? Inspection:  Grossly normal ?Breasts: Examined lying and sitting.  ? Right: Without masses, retractions, nipple discharge or axillary adenopathy. ? ? Left: Without masses, retractions, nipple discharge or axillary adenopathy. ?Genitourinary  ? Inguinal/mons:  Normal without inguinal  adenopathy ? External genitalia:  Normal appearing vulva with no masses, tenderness, or lesions ? BUS/Urethra/Skene's glands:  Normal ? Vagina:  Normal appearing with normal color and discharge, no lesions. Atrophic changes ? Cervix:  Normal appearing without discharge or lesions ? Uterus:  Normal in size, shape and contour.  Midline and mobile, nontender ? Adnexa/parametria:   ?  Rt: Normal in size, without masses or tenderness. ?  Lt: Normal in size, without masses or tenderness. ? Anus and perineum: Normal ? Digital rectal exam: Normal sphincter tone without palpated masses or tenderness ? ?Patient informed chaperone available to be present for breast and pelvic exam. Patient has requested no chaperone to be present. Patient has been advised what will be completed during breast and pelvic exam.  ? ?Assessment/Plan:  66 y.o. [name] for annual exam.  ? ?Well female exam with routine gynecological exam - Education provided on SBEs, importance of preventative screenings, current guidelines, high calcium diet, regular exercise, and multivitamin daily. Establishing with new PCP soon.  ? ?Postmenopausal - no HRT, no bleeding ? ?Age-related osteoporosis without current pathological fracture - Plan: alendronate (FOSAMAX) 70 MG tablet weekly. Saw ENT last year for dysphagia. Cleared to continue Fosamax. Stable. ? ?HSV (herpes simplex virus) infection - Valtrex as needed. Does not need refill at this time.  ? ?Screening for cervical cancer - Normal Pap history. No longer screening per guidelines.  ? ?Screening for breast cancer - Normal mammogram history.  Continue annual screenings.  Normal breast exam today ? ?Screening for colon cancer - 2015 colonoscopy. Will repeat at GI's recommended interval.  ? ?Return in 1 year for breast and pelvic exam.  ? ? ?Tamela Gammon DNP, 3:48 PM 04/01/2022 ? ?

## 2022-05-21 DIAGNOSIS — Z1231 Encounter for screening mammogram for malignant neoplasm of breast: Secondary | ICD-10-CM | POA: Diagnosis not present

## 2022-06-03 DIAGNOSIS — E785 Hyperlipidemia, unspecified: Secondary | ICD-10-CM | POA: Diagnosis not present

## 2022-06-03 DIAGNOSIS — I10 Essential (primary) hypertension: Secondary | ICD-10-CM | POA: Diagnosis not present

## 2022-06-05 DIAGNOSIS — Z1211 Encounter for screening for malignant neoplasm of colon: Secondary | ICD-10-CM | POA: Diagnosis not present

## 2022-06-05 DIAGNOSIS — R69 Illness, unspecified: Secondary | ICD-10-CM | POA: Diagnosis not present

## 2022-06-05 DIAGNOSIS — Z23 Encounter for immunization: Secondary | ICD-10-CM | POA: Diagnosis not present

## 2022-06-05 DIAGNOSIS — E785 Hyperlipidemia, unspecified: Secondary | ICD-10-CM | POA: Diagnosis not present

## 2022-06-05 DIAGNOSIS — I10 Essential (primary) hypertension: Secondary | ICD-10-CM | POA: Diagnosis not present

## 2022-06-05 DIAGNOSIS — Z0000 Encounter for general adult medical examination without abnormal findings: Secondary | ICD-10-CM | POA: Diagnosis not present

## 2022-06-07 ENCOUNTER — Other Ambulatory Visit: Payer: Self-pay | Admitting: Nurse Practitioner

## 2022-06-07 DIAGNOSIS — M810 Age-related osteoporosis without current pathological fracture: Secondary | ICD-10-CM

## 2022-06-23 DIAGNOSIS — Z1211 Encounter for screening for malignant neoplasm of colon: Secondary | ICD-10-CM | POA: Diagnosis not present

## 2022-09-03 ENCOUNTER — Other Ambulatory Visit: Payer: Self-pay | Admitting: Nurse Practitioner

## 2022-09-03 DIAGNOSIS — B009 Herpesviral infection, unspecified: Secondary | ICD-10-CM

## 2022-09-04 NOTE — Telephone Encounter (Signed)
TW pt.  Last AEX 04/01/2022--nothing scheduled for 2024 as of yet.

## 2022-10-12 ENCOUNTER — Other Ambulatory Visit: Payer: Self-pay | Admitting: Nurse Practitioner

## 2022-10-12 DIAGNOSIS — F419 Anxiety disorder, unspecified: Secondary | ICD-10-CM

## 2022-10-13 NOTE — Telephone Encounter (Signed)
Last AEX 04/01/2022--nothing scheduled yet for 2024.

## 2022-11-03 DIAGNOSIS — R69 Illness, unspecified: Secondary | ICD-10-CM | POA: Diagnosis not present

## 2022-11-03 DIAGNOSIS — R599 Enlarged lymph nodes, unspecified: Secondary | ICD-10-CM | POA: Diagnosis not present

## 2022-11-03 DIAGNOSIS — I10 Essential (primary) hypertension: Secondary | ICD-10-CM | POA: Diagnosis not present

## 2022-11-30 DIAGNOSIS — L821 Other seborrheic keratosis: Secondary | ICD-10-CM | POA: Diagnosis not present

## 2022-11-30 DIAGNOSIS — D044 Carcinoma in situ of skin of scalp and neck: Secondary | ICD-10-CM | POA: Diagnosis not present

## 2022-11-30 DIAGNOSIS — L814 Other melanin hyperpigmentation: Secondary | ICD-10-CM | POA: Diagnosis not present

## 2022-11-30 DIAGNOSIS — G629 Polyneuropathy, unspecified: Secondary | ICD-10-CM | POA: Diagnosis not present

## 2022-11-30 DIAGNOSIS — D225 Melanocytic nevi of trunk: Secondary | ICD-10-CM | POA: Diagnosis not present

## 2022-11-30 DIAGNOSIS — L720 Epidermal cyst: Secondary | ICD-10-CM | POA: Diagnosis not present

## 2022-11-30 DIAGNOSIS — D485 Neoplasm of uncertain behavior of skin: Secondary | ICD-10-CM | POA: Diagnosis not present

## 2022-11-30 DIAGNOSIS — L600 Ingrowing nail: Secondary | ICD-10-CM | POA: Diagnosis not present

## 2022-11-30 DIAGNOSIS — L578 Other skin changes due to chronic exposure to nonionizing radiation: Secondary | ICD-10-CM | POA: Diagnosis not present

## 2022-11-30 DIAGNOSIS — L309 Dermatitis, unspecified: Secondary | ICD-10-CM | POA: Diagnosis not present

## 2022-11-30 DIAGNOSIS — M79671 Pain in right foot: Secondary | ICD-10-CM | POA: Diagnosis not present

## 2022-12-09 DIAGNOSIS — M25561 Pain in right knee: Secondary | ICD-10-CM | POA: Diagnosis not present

## 2022-12-09 DIAGNOSIS — I10 Essential (primary) hypertension: Secondary | ICD-10-CM | POA: Diagnosis not present

## 2022-12-22 DIAGNOSIS — L03211 Cellulitis of face: Secondary | ICD-10-CM | POA: Diagnosis not present

## 2022-12-22 DIAGNOSIS — H1031 Unspecified acute conjunctivitis, right eye: Secondary | ICD-10-CM | POA: Diagnosis not present

## 2022-12-31 DIAGNOSIS — M25561 Pain in right knee: Secondary | ICD-10-CM | POA: Diagnosis not present

## 2023-02-02 DIAGNOSIS — I10 Essential (primary) hypertension: Secondary | ICD-10-CM | POA: Diagnosis not present

## 2023-02-02 DIAGNOSIS — R69 Illness, unspecified: Secondary | ICD-10-CM | POA: Diagnosis not present

## 2023-02-02 DIAGNOSIS — H532 Diplopia: Secondary | ICD-10-CM | POA: Diagnosis not present

## 2023-02-08 DIAGNOSIS — L089 Local infection of the skin and subcutaneous tissue, unspecified: Secondary | ICD-10-CM | POA: Diagnosis not present

## 2023-03-13 ENCOUNTER — Other Ambulatory Visit: Payer: Self-pay | Admitting: Nurse Practitioner

## 2023-03-13 DIAGNOSIS — M810 Age-related osteoporosis without current pathological fracture: Secondary | ICD-10-CM

## 2023-03-25 DIAGNOSIS — L0293 Carbuncle, unspecified: Secondary | ICD-10-CM | POA: Diagnosis not present

## 2023-03-25 DIAGNOSIS — L089 Local infection of the skin and subcutaneous tissue, unspecified: Secondary | ICD-10-CM | POA: Diagnosis not present

## 2023-03-26 DIAGNOSIS — L089 Local infection of the skin and subcutaneous tissue, unspecified: Secondary | ICD-10-CM | POA: Diagnosis not present

## 2023-04-01 DIAGNOSIS — A4902 Methicillin resistant Staphylococcus aureus infection, unspecified site: Secondary | ICD-10-CM | POA: Diagnosis not present

## 2023-04-05 DIAGNOSIS — D044 Carcinoma in situ of skin of scalp and neck: Secondary | ICD-10-CM | POA: Diagnosis not present

## 2023-04-14 ENCOUNTER — Encounter: Payer: Self-pay | Admitting: Nurse Practitioner

## 2023-04-14 ENCOUNTER — Ambulatory Visit (INDEPENDENT_AMBULATORY_CARE_PROVIDER_SITE_OTHER): Payer: Medicare HMO | Admitting: Nurse Practitioner

## 2023-04-14 VITALS — BP 116/74 | Ht 63.25 in | Wt 146.0 lb

## 2023-04-14 DIAGNOSIS — Z780 Asymptomatic menopausal state: Secondary | ICD-10-CM

## 2023-04-14 DIAGNOSIS — Z9189 Other specified personal risk factors, not elsewhere classified: Secondary | ICD-10-CM

## 2023-04-14 DIAGNOSIS — B009 Herpesviral infection, unspecified: Secondary | ICD-10-CM

## 2023-04-14 DIAGNOSIS — Z01419 Encounter for gynecological examination (general) (routine) without abnormal findings: Secondary | ICD-10-CM

## 2023-04-14 DIAGNOSIS — M810 Age-related osteoporosis without current pathological fracture: Secondary | ICD-10-CM

## 2023-04-14 MED ORDER — VALACYCLOVIR HCL 500 MG PO TABS
ORAL_TABLET | ORAL | 2 refills | Status: DC
Start: 2023-04-14 — End: 2024-04-18

## 2023-04-14 MED ORDER — ALENDRONATE SODIUM 70 MG PO TABS: 70.0000 mg | ORAL_TABLET | ORAL | 3 refills | Status: DC

## 2023-04-14 NOTE — Progress Notes (Signed)
Kimberly Hale 05/19/1956 [id number]   History:  67 y.o. [id number] presents for annual exam. Postmenopausal - no HRT, no bleeding. Normal pap and mammogram history. Osteoporosis - on Fosamax since 05/2019. Saw ENT in 2022 for dysphagia with normal evaluation and clearance to continue Fosamax. HSV, takes Valtrex as needed.   Gynecologic History Patient's last menstrual period was 10/08/2007.   Contraception/Family planning: post menopausal status Sexually active: No  Health Maintenance Last Pap: 01/09/2019. Results were: Normal neg HPV, 5-year repeat Last mammogram: 04/2022. Results were: Normal Last colonoscopy: 2015 per patient. Results were: Normal. Cologuard negative 06/2022 Last Dexa: 06/17/2021. Results were: T-score -2.7  Past medical history, past surgical history, family history and social history were all reviewed and documented in the EPIC chart. In relationship with ex husband. He has a progressive neurological disease, hospice care involved. 1 son who lives close. 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   ROS:  A ROS was performed and pertinent positives and negatives are included.  Exam:  Vitals:   04/14/23 1431  BP: 116/74  Weight: 146 lb (66.2 kg)  Height: 5' 3.25" (1.607 m)     Body mass index is 25.66 kg/m.  General appearance:  Normal Thyroid:  Symmetrical, normal in size, without palpable masses or nodularity. Respiratory  Auscultation:  Clear without wheezing or rhonchi Cardiovascular  Auscultation:  Regular rate, without rubs, murmurs or gallops  Edema/varicosities:  Not grossly evident Abdominal  Soft,nontender, without masses, guarding or rebound.  Liver/spleen:  No organomegaly noted  Hernia:  None appreciated  Skin  Inspection:  Grossly normal Breasts: Examined lying and sitting.   Right: Without masses, retractions, nipple discharge or axillary adenopathy.   Left: Without masses, retractions, nipple discharge or axillary  adenopathy. Genitourinary   Inguinal/mons:  Normal without inguinal adenopathy  External genitalia:  Normal appearing vulva with no masses, tenderness, or lesions  BUS/Urethra/Skene's glands:  Normal  Vagina:  Normal appearing with normal color and discharge, no lesions. Atrophic changes  Cervix:  Normal appearing without discharge or lesions  Uterus:  Normal in size, shape and contour.  Midline and mobile, nontender  Adnexa/parametria:     Rt: Normal in size, without masses or tenderness.   Lt: Normal in size, without masses or tenderness.  Anus and perineum: Normal  Digital rectal exam: Deferred  Patient informed chaperone available to be present for breast and pelvic exam. Patient has requested no chaperone to be present. Patient has been advised what will be completed during breast and pelvic exam.   Assessment/Plan:  67 y.o. [name] for annual exam.   Encounter for breast and pelvic examination - Education provided on SBEs, importance of preventative screenings, current guidelines, high calcium diet, regular exercise, and multivitamin daily. Labs with PCP.   Postmenopausal - no HRT, no bleeding  Age-related osteoporosis without current pathological fracture - Plan: alendronate (FOSAMAX) 70 MG tablet weekly. Saw ENT in 2022 for dysphagia. Cleared to continue Fosamax. Stable. Aware we are not doing DXA in our office after mid July. Will schedule at breast imaging center.   HSV infection - Plan: valACYclovir (VALTREX) 500 MG tablet BID x 3-5 days at first sign of outbreak.   Screening for cervical cancer - Normal Pap history. No longer screening per guidelines.   Screening for breast cancer - Normal mammogram history.  Continue annual screenings.  Normal breast exam today  Screening for colon cancer - 2015 colonoscopy. Will repeat at GI's recommended interval.   Return in 1 year for breast and  pelvic exam.      Olivia Mackie DNP, 2:55 PM 04/14/2023

## 2023-04-21 ENCOUNTER — Other Ambulatory Visit: Payer: Self-pay | Admitting: Nurse Practitioner

## 2023-04-21 DIAGNOSIS — M810 Age-related osteoporosis without current pathological fracture: Secondary | ICD-10-CM

## 2023-04-29 ENCOUNTER — Ambulatory Visit: Payer: Medicare HMO | Admitting: Podiatry

## 2023-04-29 ENCOUNTER — Ambulatory Visit (INDEPENDENT_AMBULATORY_CARE_PROVIDER_SITE_OTHER): Payer: Medicare HMO

## 2023-04-29 ENCOUNTER — Encounter: Payer: Self-pay | Admitting: Podiatry

## 2023-04-29 DIAGNOSIS — M79672 Pain in left foot: Secondary | ICD-10-CM | POA: Diagnosis not present

## 2023-04-29 DIAGNOSIS — M19072 Primary osteoarthritis, left ankle and foot: Secondary | ICD-10-CM

## 2023-04-29 DIAGNOSIS — M2031 Hallux varus (acquired), right foot: Secondary | ICD-10-CM | POA: Diagnosis not present

## 2023-04-29 DIAGNOSIS — L600 Ingrowing nail: Secondary | ICD-10-CM

## 2023-04-29 MED ORDER — CICLOPIROX 8 % EX SOLN: Freq: Every day | CUTANEOUS | 0 refills | Status: DC

## 2023-04-29 NOTE — Patient Instructions (Signed)

## 2023-04-29 NOTE — Progress Notes (Signed)
Subjective:  Patient ID: Kimberly Hale, female    DOB: [birth year],  MRN: [id number]  Chief Complaint  Patient presents with   Ingrown Toenail    Rm 16  Right medial hallux ingrown pain x 1 years.   * Sulfa Allergy   Foot Pain    Left foot pain x 3+ years. Pain located at the top of foot with nodule.   Nail Problem    Bilateral nail fungus Right 4th and 5th , left 2nd nail.     67 y.o. female presents with concern for pain in her left midfoot.  Previous has been diagnosed with arthritis and bone spurs there.  Has been given steroid injection which was successful in reducing her pain.  She also has concern for pain in the right great toenail on the medial border.  She has previously had the lateral border of the right hallux nail removed and that resolved her ingrown problem there but still having issues on the medial border now.  Past Medical History:  Diagnosis Date   Anxiety    Environmental allergies    GERD (gastroesophageal reflux disease)    Grover's disease    flares up intermittently ; more flares during cold season; presents on the trunk of body per patient    Headache    tension headaches occasionally    History of ETOH abuse    NONE IN 10 YEARS   Hypertension    MRSA (methicillin resistant staph aureus) culture positive    Pneumonia    x3 ; twice in teens; once in late 30s/40s   Postmenopausal osteoporosis 05/2019   T score -2.7   Rosacea     Allergies  Allergen Reactions   Other Itching and Swelling    CILANTRO,  TONGUE SWELLING AND ITCHING    Sulfa Antibiotics Other (See Comments)    Does not  remember what kind of reaction she has    ROS: Negative except as per HPI above  Objective:  General: AAO x3, NAD  Dermatological: Incurvation is present along the medial nail border of the right great toe. There is localized edema without any erythema or increase in warmth around the nail border. There is no drainage or pus. There is no ascending  cellulitis. No malodor. No open lesions or pre-ulcerative lesions.  Dystrophy to the left second toenail with thickening and mild discoloration.   Vascular:  Dorsalis Pedis artery and Posterior Tibial artery pedal pulses are 2/4 bilateral.  Capillary fill time < 3 sec to all digits.   Neruologic: Grossly intact via light touch bilateral. Protective threshold intact to all sites bilateral.   Musculoskeletal: Left foot with prominence and pain with palpation along the dorsal midfoot at the second and third tarsometatarsal joint area.  Hallux varus deformity is noted on the right foot.  Gait: Unassisted, Nonantalgic.   No images are attached to the encounter.  Radiographs:  Date: 04/29/2023 XR left foot weightbearing AP/Lateral/Oblique   Findings: Attention directed to the first and second tarsometatarsal joint there is no to be decreased osseous joint space there is osseous spurring that is mild in nature and osteoarthritic changes about the first second and third tarsometatarsal joint Assessment:   1. Ingrown nail of great toe of right foot   2. Arthritis of left midfoot   3. Foot pain, left   4. Acquired hallux varus of right foot      Plan:  Patient was evaluated and treated and all questions answered.  Ingrown Nail, right -Patient elects to proceed with minor surgery to remove ingrown toenail today. Consent reviewed and signed by patient. -Ingrown nail excised. See procedure note. -Educated on post-procedure care including soaking. Written instructions provided and reviewed. -Patient to follow up in 2 weeks for nail check.  Procedure: Excision of Ingrown Toenail Location: Right 1st toe medial nail borders. Anesthesia: Lidocaine 1% plain; 1.5 mL and Marcaine 0.5% plain; 1.5 mL, digital block. Skin Prep: Betadine. Dressing: Silvadene; telfa; dry, sterile, compression dressing. Technique: Following skin prep, the toe was exsanguinated and a tourniquet was secured at the base  of the toe. The affected nail border was freed, split with a nail splitter, and excised. Chemical matrixectomy was then performed with phenol and irrigated out with alcohol. The tourniquet was then removed and sterile dressing applied. Disposition: Patient tolerated procedure well. Patient to return in 2 weeks for follow-up.    # Onychomycosis -eRx Penlac 8% topical solution applied to the left second toenail daily for the next 6 months  # Hallux varus right -Discussed with patient surgical correction including first MPJ arthrodesis -Patient is wishes to defer at this time  # Left midfoot arthritis -Recommend steroid injection and patient is agreeable she has had relief with this in the past -After sterile alcohol prep injection of 1 cc half percent Marcaine plain 1 cc Kenalog 10 into the left dorsal midfoot for osteoarthritis.  Return in about 2 weeks (around 05/13/2023) for Nail check.          Corinna Gab, DPM [facility] / [facility]

## 2023-05-03 ENCOUNTER — Other Ambulatory Visit: Payer: Self-pay | Admitting: Podiatry

## 2023-05-03 DIAGNOSIS — M19072 Primary osteoarthritis, left ankle and foot: Secondary | ICD-10-CM

## 2023-05-03 DIAGNOSIS — L600 Ingrowing nail: Secondary | ICD-10-CM

## 2023-05-03 DIAGNOSIS — M2031 Hallux varus (acquired), right foot: Secondary | ICD-10-CM

## 2023-05-03 DIAGNOSIS — M79672 Pain in left foot: Secondary | ICD-10-CM

## 2023-05-13 ENCOUNTER — Ambulatory Visit: Payer: Medicare HMO | Admitting: Podiatry

## 2023-06-03 DIAGNOSIS — E349 Endocrine disorder, unspecified: Secondary | ICD-10-CM | POA: Diagnosis not present

## 2023-06-03 DIAGNOSIS — N958 Other specified menopausal and perimenopausal disorders: Secondary | ICD-10-CM | POA: Diagnosis not present

## 2023-06-03 DIAGNOSIS — M8588 Other specified disorders of bone density and structure, other site: Secondary | ICD-10-CM | POA: Diagnosis not present

## 2023-06-03 DIAGNOSIS — Z1231 Encounter for screening mammogram for malignant neoplasm of breast: Secondary | ICD-10-CM | POA: Diagnosis not present

## 2023-06-07 ENCOUNTER — Encounter: Payer: Self-pay | Admitting: Nurse Practitioner

## 2023-06-16 DIAGNOSIS — E049 Nontoxic goiter, unspecified: Secondary | ICD-10-CM | POA: Diagnosis not present

## 2023-06-16 DIAGNOSIS — I10 Essential (primary) hypertension: Secondary | ICD-10-CM | POA: Diagnosis not present

## 2023-06-16 DIAGNOSIS — E785 Hyperlipidemia, unspecified: Secondary | ICD-10-CM | POA: Diagnosis not present

## 2023-06-23 DIAGNOSIS — Z1211 Encounter for screening for malignant neoplasm of colon: Secondary | ICD-10-CM | POA: Diagnosis not present

## 2023-06-23 DIAGNOSIS — F419 Anxiety disorder, unspecified: Secondary | ICD-10-CM | POA: Diagnosis not present

## 2023-06-23 DIAGNOSIS — K219 Gastro-esophageal reflux disease without esophagitis: Secondary | ICD-10-CM | POA: Diagnosis not present

## 2023-06-23 DIAGNOSIS — Z0000 Encounter for general adult medical examination without abnormal findings: Secondary | ICD-10-CM | POA: Diagnosis not present

## 2023-06-23 DIAGNOSIS — Z9181 History of falling: Secondary | ICD-10-CM | POA: Diagnosis not present

## 2023-06-23 DIAGNOSIS — E785 Hyperlipidemia, unspecified: Secondary | ICD-10-CM | POA: Diagnosis not present

## 2023-06-23 DIAGNOSIS — M810 Age-related osteoporosis without current pathological fracture: Secondary | ICD-10-CM | POA: Diagnosis not present

## 2023-06-23 DIAGNOSIS — I10 Essential (primary) hypertension: Secondary | ICD-10-CM | POA: Diagnosis not present

## 2023-06-24 DIAGNOSIS — Z1211 Encounter for screening for malignant neoplasm of colon: Secondary | ICD-10-CM | POA: Diagnosis not present

## 2023-07-20 DIAGNOSIS — H524 Presbyopia: Secondary | ICD-10-CM | POA: Diagnosis not present

## 2023-07-24 DIAGNOSIS — Z0100 Encounter for examination of eyes and vision without abnormal findings: Secondary | ICD-10-CM | POA: Diagnosis not present

## 2023-07-26 ENCOUNTER — Other Ambulatory Visit: Payer: Self-pay | Admitting: Podiatry

## 2023-07-27 MED ORDER — CICLOPIROX 8 % EX SOLN: Freq: Every day | CUTANEOUS | 0 refills | Status: DC

## 2023-08-19 ENCOUNTER — Ambulatory Visit: Payer: Medicare HMO | Admitting: Podiatry

## 2023-08-19 DIAGNOSIS — M2031 Hallux varus (acquired), right foot: Secondary | ICD-10-CM

## 2023-08-19 DIAGNOSIS — L600 Ingrowing nail: Secondary | ICD-10-CM

## 2023-08-19 NOTE — Patient Instructions (Signed)

## 2023-08-19 NOTE — Progress Notes (Signed)
Subjective:  Patient ID: Kimberly Hale, female    DOB: [birth year],  MRN: [id number]  Chief Complaint  Patient presents with   Nail Problem    had right great toe ingrown removed 6/6 and now having throbbing and redness around cuticle. No pus or bleeding. Patient has been soaking and applying mupirocin ointment.     67 y.o. female presents with concern for ingrown on the right great toe medial border.  She had it removed on June 6 and has now had some regrowth of the nail and the medial border having pain and redness she has been soaking and applying mupirocin ointment.  Past Medical History:  Diagnosis Date   Anxiety    Environmental allergies    GERD (gastroesophageal reflux disease)    Grover's disease    flares up intermittently ; more flares during cold season; presents on the trunk of body per patient    Headache    tension headaches occasionally    History of ETOH abuse    NONE IN 10 YEARS   Hypertension    MRSA (methicillin resistant staph aureus) culture positive    Pneumonia    x3 ; twice in teens; once in late 30s/40s   Postmenopausal osteoporosis 05/2019   T score -2.7   Rosacea     Allergies  Allergen Reactions   Other Itching and Swelling    CILANTRO,  TONGUE SWELLING AND ITCHING    Sulfa Antibiotics Other (See Comments)    Does not  remember what kind of reaction she has    ROS: Negative except as per HPI above  Objective:  General: AAO x3, NAD  Dermatological: Incurvation is present along the medial nail border of the right great toe. There is localized edema without any erythema or increase in warmth around the nail border. There is no drainage or pus. There is no ascending cellulitis. No malodor. No open lesions or pre-ulcerative lesions.    Vascular:  Dorsalis Pedis artery and Posterior Tibial artery pedal pulses are 2/4 bilateral.  Capillary fill time < 3 sec to all digits.   Neruologic: Grossly intact via light touch bilateral. Protective  threshold intact to all sites bilateral.   Musculoskeletal: Hallux varus deformity noted to right foot  Gait: Unassisted, Nonantalgic.   No images are attached to the encounter.  Assessment:   1. Ingrown nail of great toe of right foot   2. Acquired hallux varus of right foot      Plan:  Patient was evaluated and treated and all questions answered.   Ingrown Nail, right recurrent ingrown right great toe medial border -Patient elects to proceed with minor surgery to remove ingrown toenail today. Consent reviewed and signed by patient. -Ingrown nail excised. See procedure note. -Educated on post-procedure care including soaking. Written instructions provided and reviewed. -Patient to follow up in 2 weeks for nail check.  Procedure: Excision of Ingrown Toenail Location: Right 1st toe medial nail borders. Anesthesia: Lidocaine 1% plain; 1.5 mL and Marcaine 0.5% plain; 1.5 mL, digital block. Skin Prep: Betadine. Dressing: Silvadene; telfa; dry, sterile, compression dressing. Technique: Following skin prep, the toe was exsanguinated and a tourniquet was secured at the base of the toe. The affected nail border was freed, split with a nail splitter, and excised. Chemical matrixectomy was then performed with NaOH and irrigated out with vinegar. The tourniquet was then removed and sterile dressing applied. Disposition: Patient tolerated procedure well. Patient to return in 2 weeks for follow-up.  Return in about 2 weeks (around 09/02/2023) for nail check.          Corinna Gab, DPM [facility] / [facility]

## 2023-09-02 ENCOUNTER — Encounter: Payer: Self-pay | Admitting: Podiatry

## 2023-09-02 ENCOUNTER — Ambulatory Visit: Payer: Medicare HMO | Admitting: Podiatry

## 2023-09-02 DIAGNOSIS — L600 Ingrowing nail: Secondary | ICD-10-CM | POA: Diagnosis not present

## 2023-09-02 NOTE — Progress Notes (Signed)
Subjective: Kimberly Hale is a 67 y.o.  female returns to office today for follow up evaluation after having right Hallux medial border nail ingrown removal with phenol and alcohol matrixectomy approximately 2 weeks ago. Patient has been soaking using epsom salts or vinegar and applying topical antibiotic covered with bandaid daily. Patient denies fevers, chills, nausea, vomiting. Denies any calf pain, chest pain, SOB.   Objective:  Vitals: Reviewed  General: Well developed, nourished, in no acute distress, alert and oriented x3   Dermatology: Skin is warm, dry and supple bilateral. right hallux nail border appears to be clean, dry, with mild granular tissue and surrounding scab. There is no surrounding erythema, edema, drainage/purulence. The remaining nails appear unremarkable at this time. There are no other lesions or other signs of infection present.  Neurovascular status: Intact. No lower extremity swelling; No pain with calf compression bilateral.  Musculoskeletal: Decreased tenderness to palpation of the right hallux nail fold(s). Muscular strength within normal limits bilateral.   Assesement and Plan: S/p phenol and alcohol matrixectomy to the  right hallux nail medial, doing well.   -Continue soaking in epsom salts twice a day followed by antibiotic ointment and a band-aid. Can leave uncovered at night. Continue this until completely healed.  -If the area has not healed in 2 weeks, call the office for follow-up appointment, or sooner if any problems arise.  -Monitor for any signs/symptoms of infection. Call the office immediately if any occur or go directly to the emergency room. Call with any questions/concerns.        Corinna Gab, DPM [facility] / [facility]                   09/02/2023

## 2023-09-23 ENCOUNTER — Other Ambulatory Visit: Payer: Self-pay | Admitting: Podiatry

## 2023-09-23 MED ORDER — CICLOPIROX 8 % EX SOLN: Freq: Every day | CUTANEOUS | 0 refills | Status: DC

## 2023-09-24 ENCOUNTER — Telehealth: Payer: Self-pay

## 2023-09-24 NOTE — Telephone Encounter (Signed)
PA request for ciclopirox 8% solution received from covermymeds. PA initiated and waiting on reply

## 2023-12-14 DIAGNOSIS — R202 Paresthesia of skin: Secondary | ICD-10-CM | POA: Diagnosis not present

## 2023-12-14 DIAGNOSIS — Z85828 Personal history of other malignant neoplasm of skin: Secondary | ICD-10-CM | POA: Diagnosis not present

## 2023-12-14 DIAGNOSIS — L570 Actinic keratosis: Secondary | ICD-10-CM | POA: Diagnosis not present

## 2023-12-14 DIAGNOSIS — D225 Melanocytic nevi of trunk: Secondary | ICD-10-CM | POA: Diagnosis not present

## 2023-12-14 DIAGNOSIS — L814 Other melanin hyperpigmentation: Secondary | ICD-10-CM | POA: Diagnosis not present

## 2023-12-14 DIAGNOSIS — L578 Other skin changes due to chronic exposure to nonionizing radiation: Secondary | ICD-10-CM | POA: Diagnosis not present

## 2023-12-14 DIAGNOSIS — L821 Other seborrheic keratosis: Secondary | ICD-10-CM | POA: Diagnosis not present

## 2023-12-21 DIAGNOSIS — M1711 Unilateral primary osteoarthritis, right knee: Secondary | ICD-10-CM | POA: Diagnosis not present

## 2023-12-24 DIAGNOSIS — I10 Essential (primary) hypertension: Secondary | ICD-10-CM | POA: Diagnosis not present

## 2023-12-24 DIAGNOSIS — E785 Hyperlipidemia, unspecified: Secondary | ICD-10-CM | POA: Diagnosis not present

## 2023-12-24 DIAGNOSIS — F419 Anxiety disorder, unspecified: Secondary | ICD-10-CM | POA: Diagnosis not present

## 2024-02-01 DIAGNOSIS — M1711 Unilateral primary osteoarthritis, right knee: Secondary | ICD-10-CM | POA: Diagnosis not present

## 2024-04-08 ENCOUNTER — Other Ambulatory Visit: Payer: Self-pay | Admitting: Nurse Practitioner

## 2024-04-08 DIAGNOSIS — M810 Age-related osteoporosis without current pathological fracture: Secondary | ICD-10-CM

## 2024-04-09 ENCOUNTER — Encounter: Payer: Self-pay | Admitting: Nurse Practitioner

## 2024-04-10 NOTE — Telephone Encounter (Signed)
 Med refill request: Fosamax  Last AEX: 04/14/2023-TW Next AEX: 04/18/2024-TW Last MMG (if hormonal med): n/a Last DXA: 06/03/2023-low bone mass (T-score -2.4; Increased FRAX risk) Refill authorized: rx pend.   Last fill of #12 received on 01/16/2024 per pharmacy.

## 2024-04-18 ENCOUNTER — Encounter: Payer: Self-pay | Admitting: Nurse Practitioner

## 2024-04-18 ENCOUNTER — Ambulatory Visit (INDEPENDENT_AMBULATORY_CARE_PROVIDER_SITE_OTHER): Admitting: Nurse Practitioner

## 2024-04-18 VITALS — BP 120/84 | HR 75 | Ht 63.5 in | Wt 163.0 lb

## 2024-04-18 DIAGNOSIS — Z780 Asymptomatic menopausal state: Secondary | ICD-10-CM

## 2024-04-18 DIAGNOSIS — M810 Age-related osteoporosis without current pathological fracture: Secondary | ICD-10-CM

## 2024-04-18 DIAGNOSIS — B009 Herpesviral infection, unspecified: Secondary | ICD-10-CM | POA: Diagnosis not present

## 2024-04-18 DIAGNOSIS — Z9189 Other specified personal risk factors, not elsewhere classified: Secondary | ICD-10-CM | POA: Diagnosis not present

## 2024-04-18 DIAGNOSIS — Z01419 Encounter for gynecological examination (general) (routine) without abnormal findings: Secondary | ICD-10-CM

## 2024-04-18 MED ORDER — ALENDRONATE SODIUM 70 MG PO TABS
70.0000 mg | ORAL_TABLET | ORAL | 3 refills | Status: AC
Start: 2024-04-18 — End: ?

## 2024-04-18 MED ORDER — VALACYCLOVIR HCL 500 MG PO TABS
ORAL_TABLET | ORAL | 2 refills | Status: AC
Start: 2024-04-18 — End: ?

## 2024-04-18 NOTE — Progress Notes (Signed)
 Kimberly Hale [birth year] [id number]   History:  68 y.o. [id number] presents for annual exam. Postmenopausal - no HRT, no bleeding. Normal pap and mammogram history. Osteoporosis - on Fosamax  since 05/2019. Saw ENT in 2022 for dysphagia with normal evaluation and clearance to continue Fosamax . HSV, takes Valtrex  as needed.   Gynecologic History Patient's last menstrual period was 10/08/2007.   Contraception/Family planning: post menopausal status Sexually active: No  Health Maintenance Last Pap: 01/09/2019. Results were: Normal neg HPV Last mammogram: 06/03/2023. Results were: Normal Last colonoscopy: 2015 per patient. Results were: Normal. Cologuard negative 06/2022 Last Dexa: 06/03/2023. Results were: T-score -2.4 (-2.7 in 2022)  Past medical history, past surgical history, family history and social history were all reviewed and documented in the EPIC chart. Was caring for ex husband who had rare neurological disease, passed last year. 1 son who lives close. 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   ROS:  A ROS was performed and pertinent positives and negatives are included.  Exam:  Vitals:   04/18/24 1434  BP: 120/84  Pulse: 75  SpO2: 99%  Weight: 163 lb (73.9 kg)  Height: 5' 3.5" (1.613 m)      Body mass index is 28.42 kg/m.  General appearance:  Normal Thyroid:  Symmetrical, normal in size, without palpable masses or nodularity. Respiratory  Auscultation:  Clear without wheezing or rhonchi Cardiovascular  Auscultation:  Regular rate, without rubs, murmurs or gallops  Edema/varicosities:  Not grossly evident Abdominal  Soft,nontender, without masses, guarding or rebound.  Liver/spleen:  No organomegaly noted  Hernia:  None appreciated  Skin  Inspection:  Grossly normal Breasts: Examined lying and sitting.   Right: Without masses, retractions, nipple discharge or axillary adenopathy.   Left: Without masses, retractions, nipple discharge or axillary  adenopathy. Pelvic: External genitalia:  no lesions              Urethra:  normal appearing urethra with no masses, tenderness or lesions              Bartholins and Skenes: normal                 Vagina: normal appearing vagina with normal color and discharge, no lesions. Atrophic changes              Cervix: no lesions Bimanual Exam:  Uterus:  no masses or tenderness              Adnexa: no mass, fullness, tenderness              Rectovaginal: Deferred              Anus:  normal, no lesions  Patient informed chaperone available to be present for breast and pelvic exam. Patient has requested no chaperone to be present. Patient has been advised what will be completed during breast and pelvic exam.   Assessment/Plan:  68 y.o. [id number] for annual exam.   Encounter for breast and pelvic examination - Education provided on SBEs, importance of preventative screenings, current guidelines, high calcium diet, regular exercise, and multivitamin daily. Labs with PCP.   Postmenopausal - no HRT, no bleeding  Age-related osteoporosis without current pathological fracture - Plan: alendronate  (FOSAMAX ) 70 MG tablet weekly. T-score -2.4 (-2.7 in 2022). Will continue Fosamax , repeat DXA next July.   HSV infection - Plan: valACYclovir  (VALTREX ) 500 MG tablet BID x 3-5 days at first sign of outbreak.   Screening for cervical cancer - Normal Pap history. No longer screening  per guidelines.   Screening for breast cancer - Normal mammogram history.  Continue annual screenings.  Normal breast exam today  Screening for colon cancer - 2015 colonoscopy. Will repeat at GI's recommended interval.   Return in about 1 year (around 04/18/2025) for B&P (high risk).     Kimberly Bamberger DNP, 3:09 PM 04/18/2024

## 2024-04-25 DIAGNOSIS — L820 Inflamed seborrheic keratosis: Secondary | ICD-10-CM | POA: Diagnosis not present

## 2024-06-06 DIAGNOSIS — Z1231 Encounter for screening mammogram for malignant neoplasm of breast: Secondary | ICD-10-CM | POA: Diagnosis not present

## 2024-06-06 LAB — HM MAMMOGRAPHY

## 2024-06-12 ENCOUNTER — Encounter: Payer: Self-pay | Admitting: Nurse Practitioner

## 2024-06-20 DIAGNOSIS — E559 Vitamin D deficiency, unspecified: Secondary | ICD-10-CM | POA: Diagnosis not present

## 2024-06-20 DIAGNOSIS — E785 Hyperlipidemia, unspecified: Secondary | ICD-10-CM | POA: Diagnosis not present

## 2024-06-20 DIAGNOSIS — I10 Essential (primary) hypertension: Secondary | ICD-10-CM | POA: Diagnosis not present

## 2024-06-20 DIAGNOSIS — M810 Age-related osteoporosis without current pathological fracture: Secondary | ICD-10-CM | POA: Diagnosis not present

## 2024-06-27 DIAGNOSIS — I10 Essential (primary) hypertension: Secondary | ICD-10-CM | POA: Diagnosis not present

## 2024-06-28 ENCOUNTER — Other Ambulatory Visit (HOSPITAL_COMMUNITY): Payer: Self-pay | Admitting: Family Medicine

## 2024-06-28 DIAGNOSIS — Z1211 Encounter for screening for malignant neoplasm of colon: Secondary | ICD-10-CM | POA: Diagnosis not present

## 2024-06-28 DIAGNOSIS — E785 Hyperlipidemia, unspecified: Secondary | ICD-10-CM

## 2024-07-12 ENCOUNTER — Ambulatory Visit (HOSPITAL_BASED_OUTPATIENT_CLINIC_OR_DEPARTMENT_OTHER)
Admission: RE | Admit: 2024-07-12 | Discharge: 2024-07-12 | Disposition: A | Discharge status: Home / Self Care | Payer: Self-pay | Source: Ambulatory Visit | Attending: Family Medicine | Admitting: Family Medicine

## 2024-07-12 DIAGNOSIS — E785 Hyperlipidemia, unspecified: Secondary | ICD-10-CM | POA: Insufficient documentation

## 2024-09-13 DIAGNOSIS — E785 Hyperlipidemia, unspecified: Secondary | ICD-10-CM | POA: Diagnosis not present

## 2024-09-25 ENCOUNTER — Ambulatory Visit: Admitting: Podiatry

## 2024-09-25 DIAGNOSIS — L600 Ingrowing nail: Secondary | ICD-10-CM

## 2024-09-25 DIAGNOSIS — M25572 Pain in left ankle and joints of left foot: Secondary | ICD-10-CM

## 2024-09-25 DIAGNOSIS — L84 Corns and callosities: Secondary | ICD-10-CM

## 2024-09-25 NOTE — Patient Instructions (Signed)

## 2024-09-25 NOTE — Progress Notes (Unsigned)
 Subjective:  Patient ID: Kimberly Hale, female    DOB: Mar 23, 1956,  MRN: [id number]  Kimberly Hale presents to clinic today for:  Chief Complaint  Patient presents with   Callouses    Second toe of left foot, cuticle underneath toe nail is painful. Corns on bottom and 5th toe bilateral.   Patient presents with several concerns today.  She notes a chronic ingrowing toenail to the left second toe along the medial border.  She is open to having a procedure performed today.  Denies any drainage.  Notes tenderness with pressure on the area.  As she requested that all of her corns to be shaved today.  She pointed to the bilateral 4th and 5th toes bilateral as well as left submet 4 as areas that she would like to have shaved.  She also wants to talk about the arthritic spurring on the dorsal aspect of her left midfoot.  She states that she was diagnosed with this in the past but thinks it might be getting worse.  She wanted to talk about treatment options.  Allergies  Allergen Reactions   Other Itching and Swelling    CILANTRO,  TONGUE SWELLING AND ITCHING    Sulfa Antibiotics Other (See Comments)    Does not  remember what kind of reaction she has    Objective:  Kimberly Hale is a pleasant 68 y.o. female in NAD. AAO x 3.  Vascular Examination: Capillary refill time is 3-5 seconds to toes bilateral. Palpable pedal pulses b/l LE. Digital hair present b/l. No pedal edema b/l. Skin temperature gradient WNL b/l. No varicosities b/l. No cyanosis or clubbing noted b/l.   Dermatological Examination: There is incurvation of the left second toe medial nail border.  There is pain on palpation of the affected nail border.  No drainage is noted.  There is mild localized erythema to the area.  There are hyperkeratotic lesions on the dorsal aspect of the 4th and 5th toe PIPJ's as well as left submet 4.  No sign of ulceration noted  Neurological Examination: Epicritic  sensation is intact to the toes.  Assessment/Plan: 1. Ingrown toenail   2. Corns   3. Arthralgia of left foot     Discussed patient's condition today.  After obtaining patient consent, the left second toe was anesthetized with a 50:50 mixture of 1% lidocaine  plain and 0.5% bupivacaine  plain for a total of 3cc's administered.  Upon confirmation of anesthesia, a freer elevator was utilized to free the left second toe medial nail border from the nail bed.  The nail border was then avulsed proximal to the eponychium and removed in toto.  The area was inspected for any remaining spicules.  A chemical matrixectomy was performed with NaOH and neutralized with acetic acid solution.  Antibiotic ointment and a DSD were applied, followed by a Coban dressing.  Patient tolerated the anesthetic and procedure well and will f/u in 2-3 weeks for recheck.  Patient given post-procedure instructions for daily 15-minute Epsom salt soaks, antibiotic ointment and daily use of Bandaids until toe starts to dry / form eschar.   The hyperkeratotic lesions were carefully shaved with sterile #15 blade.  Informed patient that we will need to reschedule of the evaluation of her left dorsal midfoot discomfort.  We will need to allow extra time for x-rays and thorough evaluation.  Will request extra time for her PNA follow-up to perform x-rays left foot and discuss treatment options.   Return in  about 2 weeks (around 10/09/2024) for PNA recheck & XR left midfoot for arthritis eval (30 min appt).   Kimberly Hale, DPM, FACFAS [facility]     [address] [place], [place] [address]                Office [phone]  Fax [phone]

## 2024-10-09 ENCOUNTER — Ambulatory Visit: Admitting: Podiatry

## 2024-10-09 ENCOUNTER — Ambulatory Visit (INDEPENDENT_AMBULATORY_CARE_PROVIDER_SITE_OTHER)

## 2024-10-09 ENCOUNTER — Encounter: Payer: Self-pay | Admitting: Podiatry

## 2024-10-09 DIAGNOSIS — M19072 Primary osteoarthritis, left ankle and foot: Secondary | ICD-10-CM

## 2024-10-09 DIAGNOSIS — L600 Ingrowing nail: Secondary | ICD-10-CM | POA: Diagnosis not present

## 2024-10-09 NOTE — Progress Notes (Signed)
 Subjective:  Patient ID: Kimberly Hale, female    DOB: 03/05/1956,  MRN: [id number]  Chief Complaint  Patient presents with   Nail Problem    2 week PNA recheck & XR left midfoot for arthritis eval. 3 Pain. Non diabetic.    Kimberly Hale presents to clinic today for f/u of PNA to the left second toenail medial border.  She notes that there is still some moisture/tenderness near the proximal nail fold on the medial margin.  She does note some hard skin near the tip of the second toe that has been uncomfortable.  She is also here for x-ray and evaluation of her left dorsal midfoot arthritis.  PCP is Pa, [employer].  Allergies  Allergen Reactions   Other Itching and Swelling    CILANTRO,  TONGUE SWELLING AND ITCHING    Sulfa Antibiotics Other (See Comments)    Does not  remember what kind of reaction she has    Objective: Vascular Examination: Capillary refill time is 3-5 seconds to toes bilateral. Palpable pedal pulses b/l LE. Digital hair present b/l. No pedal edema b/l. Skin temperature gradient WNL b/l. No varicosities b/l. No cyanosis or clubbing noted b/l.   Dermatological Examination: Upon inspection of the PNA site, there are no clinical signs of infection.  No purulence, no necrosis, no malodor present.  Minimal to no erythema present.  Eschar formed along nail margin.  Minimal to no pain on palpation of area.   Orthopedic examination: There is a palpable bony prominence at the dorsal aspect of the second metatarsal-intermediate cuneiform joint.  There is a small cyst dorsal to this joint area.  It is semi-- mobile but not freely mobile within the soft tissues.  Percussion of the deep peroneal nerve in this area reproduces Tinel's sign.  Assessment/Plan: 1. Arthritis of left midfoot   2. Ingrown toenail     Discussed findings with the patient today.  Betadine  and Band-Aid were applied to the left second toe.  She will start  applying Betadine  with a Band-Aid on during the day but leave it open to the air at nighttime.  Reviewed x-ray findings today, left foot, 3 weightbearing views obtained today.  She does have some dorsal spurring at the second metatarsal-intermediate cuneiform joint.  There is significant joint space narrowing at the 2nd and 3rd metatarsal-cuneiform joints.  No fracture is seen.  There is normal osseous mineralization present.  Recommended Voltaren gel or Absorbine veterinary liniment to apply daily to the dorsal left midfoot.  Recommended shoes with good arch support to protect and support the area from the underside to decrease strain on the midfoot joints.  Recommended cortisone injection but she stated she had a bad experience when one of our previous providers administered an injection so she wants to hold off on this.  Ultimately if she still having continued pain or limited ambulation secondary to the arthritis pain, she can undergo surgical correction.  She wants to hold off at this time.  Follow-up as needed   Awanda CHARM Imperial, DPM, FACFAS [facility]     [address] [place], [place] [address]  Office [phone]  Fax [phone]

## 2024-10-25 ENCOUNTER — Encounter: Payer: Self-pay | Admitting: Nurse Practitioner

## 2024-11-15 DIAGNOSIS — M5416 Radiculopathy, lumbar region: Secondary | ICD-10-CM | POA: Diagnosis not present

## 2024-11-15 DIAGNOSIS — M545 Low back pain, unspecified: Secondary | ICD-10-CM | POA: Diagnosis not present
